# Patient Record
Sex: Male | Born: 1955 | Race: White | Hispanic: No | Marital: Married | Smoking: Never smoker
Health system: Southern US, Community
[De-identification: ages and names within clinical notes are randomized; demographics above are authoritative.]

## PROBLEM LIST (undated history)

## (undated) DIAGNOSIS — F32A Depression, unspecified: Secondary | ICD-10-CM

## (undated) DIAGNOSIS — H409 Unspecified glaucoma: Secondary | ICD-10-CM

## (undated) DIAGNOSIS — J069 Acute upper respiratory infection, unspecified: Secondary | ICD-10-CM

## (undated) DIAGNOSIS — H348192 Central retinal vein occlusion, unspecified eye, stable: Secondary | ICD-10-CM

## (undated) DIAGNOSIS — L0591 Pilonidal cyst without abscess: Secondary | ICD-10-CM

## (undated) DIAGNOSIS — Z789 Other specified health status: Secondary | ICD-10-CM

## (undated) DIAGNOSIS — I10 Essential (primary) hypertension: Secondary | ICD-10-CM

## (undated) DIAGNOSIS — F419 Anxiety disorder, unspecified: Secondary | ICD-10-CM

## (undated) DIAGNOSIS — K219 Gastro-esophageal reflux disease without esophagitis: Secondary | ICD-10-CM

## (undated) DIAGNOSIS — E785 Hyperlipidemia, unspecified: Secondary | ICD-10-CM

## (undated) DIAGNOSIS — Z0282 Encounter for adoption services: Secondary | ICD-10-CM

## (undated) DIAGNOSIS — D472 Monoclonal gammopathy: Secondary | ICD-10-CM

## (undated) HISTORY — DX: Gastro-esophageal reflux disease without esophagitis: K21.9

## (undated) HISTORY — DX: Monoclonal gammopathy: D47.2

## (undated) HISTORY — DX: Central retinal vein occlusion, unspecified eye, stable: H34.8192

## (undated) HISTORY — DX: Hyperlipidemia, unspecified: E78.5

## (undated) HISTORY — DX: Pilonidal cyst without abscess: L05.91

## (undated) HISTORY — PX: PILONIDAL CYST / SINUS EXCISION: SUR543

## (undated) HISTORY — DX: Essential (primary) hypertension: I10

## (undated) HISTORY — DX: Unspecified glaucoma: H40.9

## (undated) HISTORY — PX: TONSILLECTOMY: SUR1361

---

## 1994-10-27 HISTORY — PX: INGUINAL HERNIA REPAIR: SUR1180

## 1999-05-03 ENCOUNTER — Ambulatory Visit (HOSPITAL_COMMUNITY): Admission: RE | Admit: 1999-05-03 | Discharge: 1999-05-03 | Payer: Self-pay | Admitting: Internal Medicine

## 2000-11-25 ENCOUNTER — Ambulatory Visit (HOSPITAL_COMMUNITY): Admission: RE | Admit: 2000-11-25 | Discharge: 2000-11-25 | Payer: Self-pay | Admitting: Hematology and Oncology

## 2000-12-17 ENCOUNTER — Encounter: Admission: RE | Admit: 2000-12-17 | Discharge: 2000-12-17 | Payer: Self-pay | Admitting: Internal Medicine

## 2001-02-22 ENCOUNTER — Ambulatory Visit (HOSPITAL_COMMUNITY): Admission: RE | Admit: 2001-02-22 | Discharge: 2001-02-22 | Payer: Self-pay | Admitting: Neurology

## 2001-02-22 ENCOUNTER — Encounter: Payer: Self-pay | Admitting: Neurology

## 2007-05-18 ENCOUNTER — Emergency Department: Payer: Self-pay | Admitting: Emergency Medicine

## 2007-10-28 HISTORY — PX: UPPER GI ENDOSCOPY: SHX6162

## 2007-10-28 HISTORY — PX: COLONOSCOPY: SHX174

## 2009-04-26 ENCOUNTER — Ambulatory Visit: Payer: Self-pay | Admitting: Internal Medicine

## 2009-04-27 ENCOUNTER — Ambulatory Visit: Payer: Self-pay | Admitting: Internal Medicine

## 2009-05-27 ENCOUNTER — Ambulatory Visit: Payer: Self-pay | Admitting: Internal Medicine

## 2009-06-27 ENCOUNTER — Ambulatory Visit: Payer: Self-pay | Admitting: Internal Medicine

## 2009-09-10 ENCOUNTER — Ambulatory Visit: Payer: Self-pay | Admitting: Unknown Physician Specialty

## 2009-09-26 ENCOUNTER — Ambulatory Visit: Payer: Self-pay | Admitting: Internal Medicine

## 2009-09-28 ENCOUNTER — Ambulatory Visit: Payer: Self-pay | Admitting: Internal Medicine

## 2009-10-27 ENCOUNTER — Ambulatory Visit: Payer: Self-pay | Admitting: Internal Medicine

## 2010-03-26 ENCOUNTER — Ambulatory Visit: Payer: Self-pay | Admitting: Internal Medicine

## 2010-03-27 ENCOUNTER — Ambulatory Visit: Payer: Self-pay | Admitting: Internal Medicine

## 2010-04-26 ENCOUNTER — Ambulatory Visit: Payer: Self-pay | Admitting: Internal Medicine

## 2010-10-01 ENCOUNTER — Ambulatory Visit: Payer: Self-pay | Admitting: Internal Medicine

## 2010-10-27 ENCOUNTER — Ambulatory Visit: Payer: Self-pay | Admitting: Internal Medicine

## 2011-10-09 ENCOUNTER — Ambulatory Visit: Payer: Self-pay | Admitting: Internal Medicine

## 2011-10-28 ENCOUNTER — Ambulatory Visit: Payer: Self-pay | Admitting: Internal Medicine

## 2012-10-07 ENCOUNTER — Ambulatory Visit: Payer: Self-pay | Admitting: Internal Medicine

## 2012-10-07 LAB — CBC CANCER CENTER
Basophil #: 0 x10 3/mm (ref 0.0–0.1)
Basophil %: 0.5 %
Eosinophil #: 0.3 x10 3/mm (ref 0.0–0.7)
Eosinophil %: 3.9 %
HCT: 45.2 % (ref 40.0–52.0)
HGB: 15.7 g/dL (ref 13.0–18.0)
Lymphocyte #: 1.5 x10 3/mm (ref 1.0–3.6)
Lymphocyte %: 20.7 %
MCH: 30.8 pg (ref 26.0–34.0)
MCHC: 34.8 g/dL (ref 32.0–36.0)
MCV: 89 fL (ref 80–100)
Monocyte #: 0.5 x10 3/mm (ref 0.2–1.0)
Monocyte %: 7.3 %
Neutrophil #: 4.9 x10 3/mm (ref 1.4–6.5)
Neutrophil %: 67.6 %
Platelet: 261 x10 3/mm (ref 150–440)
RBC: 5.11 10*6/uL (ref 4.40–5.90)
RDW: 13 % (ref 11.5–14.5)
WBC: 7.2 x10 3/mm (ref 3.8–10.6)

## 2012-10-08 LAB — PROT IMMUNOELECTROPHORES(ARMC)

## 2012-10-27 ENCOUNTER — Ambulatory Visit: Payer: Self-pay | Admitting: Internal Medicine

## 2013-01-25 ENCOUNTER — Ambulatory Visit: Payer: Self-pay | Admitting: Internal Medicine

## 2013-06-13 ENCOUNTER — Ambulatory Visit: Payer: Self-pay | Admitting: Internal Medicine

## 2013-10-13 ENCOUNTER — Ambulatory Visit: Payer: Self-pay | Admitting: Internal Medicine

## 2013-10-14 LAB — CBC CANCER CENTER
Basophil #: 0 x10 3/mm (ref 0.0–0.1)
Basophil %: 0.7 %
Eosinophil #: 0.5 x10 3/mm (ref 0.0–0.7)
Eosinophil %: 7.4 %
HCT: 44.7 % (ref 40.0–52.0)
HGB: 14.9 g/dL (ref 13.0–18.0)
Lymphocyte #: 1.7 x10 3/mm (ref 1.0–3.6)
Lymphocyte %: 24.4 %
MCH: 30.1 pg (ref 26.0–34.0)
MCHC: 33.2 g/dL (ref 32.0–36.0)
MCV: 91 fL (ref 80–100)
Monocyte #: 0.5 x10 3/mm (ref 0.2–1.0)
Monocyte %: 7.7 %
Neutrophil #: 4.1 x10 3/mm (ref 1.4–6.5)
Neutrophil %: 59.8 %
Platelet: 238 x10 3/mm (ref 150–440)
RBC: 4.94 10*6/uL (ref 4.40–5.90)
RDW: 13.1 % (ref 11.5–14.5)
WBC: 6.9 x10 3/mm (ref 3.8–10.6)

## 2013-10-17 LAB — PROT IMMUNOELECTROPHORES(ARMC)

## 2013-10-19 LAB — COMPREHENSIVE METABOLIC PANEL
Albumin: 4 g/dL (ref 3.4–5.0)
Alkaline Phosphatase: 76 U/L
Anion Gap: 9 (ref 7–16)
BUN: 12 mg/dL (ref 7–18)
Bilirubin,Total: 0.3 mg/dL (ref 0.2–1.0)
Calcium, Total: 9.1 mg/dL (ref 8.5–10.1)
Chloride: 105 mmol/L (ref 98–107)
Co2: 29 mmol/L (ref 21–32)
Creatinine: 0.95 mg/dL (ref 0.60–1.30)
EGFR (African American): >60
EGFR (Non-African Amer.): >60
Glucose: 80 mg/dL (ref 65–99)
Osmolality: 284 (ref 275–301)
Potassium: 4.2 mmol/L (ref 3.5–5.1)
SGOT(AST): 24 U/L (ref 15–37)
SGPT (ALT): 46 U/L (ref 12–78)
Sodium: 143 mmol/L (ref 136–145)
Total Protein: 7.6 g/dL (ref 6.4–8.2)

## 2013-10-27 ENCOUNTER — Ambulatory Visit: Payer: Self-pay | Admitting: Internal Medicine

## 2014-02-16 ENCOUNTER — Ambulatory Visit: Payer: Self-pay | Admitting: Internal Medicine

## 2014-06-15 ENCOUNTER — Ambulatory Visit: Payer: Self-pay | Admitting: Internal Medicine

## 2014-10-12 ENCOUNTER — Ambulatory Visit: Payer: Self-pay | Admitting: Internal Medicine

## 2014-10-12 LAB — CBC CANCER CENTER
Basophil #: 0 x10 3/mm (ref 0.0–0.1)
Basophil %: 0.3 %
Eosinophil #: 0.3 x10 3/mm (ref 0.0–0.7)
Eosinophil %: 4.5 %
HCT: 43 % (ref 40.0–52.0)
HGB: 14.5 g/dL (ref 13.0–18.0)
Lymphocyte #: 1.5 x10 3/mm (ref 1.0–3.6)
Lymphocyte %: 23.4 %
MCH: 30.5 pg (ref 26.0–34.0)
MCHC: 33.7 g/dL (ref 32.0–36.0)
MCV: 91 fL (ref 80–100)
Monocyte #: 0.4 x10 3/mm (ref 0.2–1.0)
Monocyte %: 6.3 %
Neutrophil #: 4.2 x10 3/mm (ref 1.4–6.5)
Neutrophil %: 65.5 %
Platelet: 255 x10 3/mm (ref 150–440)
RBC: 4.74 10*6/uL (ref 4.40–5.90)
RDW: 12.6 % (ref 11.5–14.5)
WBC: 6.4 x10 3/mm (ref 3.8–10.6)

## 2014-10-12 LAB — CREATININE, SERUM
Creatinine: 0.89 mg/dL (ref 0.60–1.30)
EGFR (African American): >60
EGFR (Non-African Amer.): >60

## 2014-10-16 LAB — PROT IMMUNOELECTROPHORES(ARMC)

## 2014-10-27 ENCOUNTER — Ambulatory Visit: Payer: Self-pay | Admitting: Internal Medicine

## 2015-02-19 ENCOUNTER — Ambulatory Visit
Admit: 2015-02-19 | Disposition: A | Discharge status: Home / Self Care | Payer: Self-pay | Attending: Internal Medicine | Admitting: Internal Medicine

## 2015-02-19 LAB — CREATININE, SERUM
Creatinine: 0.9 mg/dL
EGFR (African American): >60
EGFR (Non-African Amer.): >60

## 2015-02-20 LAB — PROT IMMUNOELECTROPHORES(ARMC)

## 2015-06-15 ENCOUNTER — Inpatient Hospital Stay: Payer: Self-pay | Attending: Family Medicine

## 2015-10-11 ENCOUNTER — Other Ambulatory Visit: Payer: Self-pay | Admitting: *Deleted

## 2015-10-11 DIAGNOSIS — D472 Monoclonal gammopathy: Secondary | ICD-10-CM

## 2015-10-12 ENCOUNTER — Inpatient Hospital Stay: Payer: 59

## 2015-10-12 ENCOUNTER — Inpatient Hospital Stay: Payer: 59 | Admitting: Internal Medicine

## 2015-11-13 ENCOUNTER — Encounter: Payer: Self-pay | Admitting: *Deleted

## 2015-11-14 ENCOUNTER — Encounter: Payer: Self-pay | Admitting: *Deleted

## 2015-11-14 ENCOUNTER — Inpatient Hospital Stay: Payer: 59 | Admitting: Internal Medicine

## 2015-11-14 ENCOUNTER — Inpatient Hospital Stay: Payer: 59 | Attending: Internal Medicine

## 2015-11-14 NOTE — Progress Notes (Signed)
No show x 3. Will send final no show letter to patient.

## 2016-09-04 ENCOUNTER — Encounter: Payer: Self-pay | Admitting: *Deleted

## 2016-09-09 DIAGNOSIS — J069 Acute upper respiratory infection, unspecified: Secondary | ICD-10-CM

## 2016-09-09 HISTORY — DX: Acute upper respiratory infection, unspecified: J06.9

## 2016-09-10 ENCOUNTER — Encounter: Payer: Self-pay | Admitting: General Surgery

## 2016-09-10 ENCOUNTER — Ambulatory Visit (INDEPENDENT_AMBULATORY_CARE_PROVIDER_SITE_OTHER): Payer: BLUE CROSS/BLUE SHIELD | Admitting: General Surgery

## 2016-09-10 VITALS — BP 110/76 | HR 66 | Resp 12 | Ht 72.0 in | Wt 167.0 lb

## 2016-09-10 DIAGNOSIS — K4090 Unilateral inguinal hernia, without obstruction or gangrene, not specified as recurrent: Secondary | ICD-10-CM

## 2016-09-10 NOTE — Patient Instructions (Signed)
Inguinal Hernia, Adult Introduction An inguinal hernia is when fat or the intestines push through the area where the leg meets the lower belly (groin) and make a rounded lump (bulge). This condition happens over time. There are three types of inguinal hernias. These types include:  Hernias that can be pushed back into the belly (are reducible).  Hernias that cannot be pushed back into the belly (are incarcerated).  Hernias that cannot be pushed back into the belly and lose their blood supply (get strangulated). This type needs emergency surgery. Follow these instructions at home: Lifestyle  Drink enough fluid to keep your urine (pee) clear or pale yellow.  Eat plenty of fruits, vegetables, and whole grains. These have a lot of fiber. Talk with your doctor if you have questions.  Avoid lifting heavy objects.  Avoid standing for long periods of time.  Do not use tobacco products. These include cigarettes, chewing tobacco, or e-cigarettes. If you need help quitting, ask your doctor.  Try to stay at a healthy weight. General instructions  Do not try to force the hernia back in.  Watch your hernia for any changes in color or size. Let your doctor know if there are any changes.  Take over-the-counter and prescription medicines only as told by your doctor.  Keep all follow-up visits as told by your doctor. This is important. Contact a doctor if:  You have a fever.  You have new symptoms.  Your symptoms get worse. Get help right away if:  The area where the legs meets the lower belly has:  Pain that gets worse suddenly.  A bulge that gets bigger suddenly and does not go down.  A bulge that turns red or purple.  A bulge that is painful to the touch.  You are a man and your scrotum:  Suddenly feels painful.  Suddenly changes in size.  You feel sick to your stomach (nauseous) and this feeling does not go away.  You throw up (vomit) and this keeps happening.  You feel  your heart beating a lot more quickly than normal.  You cannot poop (have a bowel movement) or pass gas. This information is not intended to replace advice given to you by your health care provider. Make sure you discuss any questions you have with your health care provider. Document Released: 11/13/2006 Document Revised: 03/20/2016 Document Reviewed: 08/23/2014  2017 Elsevier  

## 2016-09-10 NOTE — Progress Notes (Signed)
Patient ID: Jerry Hire, MD, male   DOB: 1956/06/06, 60 y.o.   MRN: [id number]  Chief Complaint  Patient presents with  . Other    left inguinal hernia  . 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 He states he notified the area about 6 months ago. It does seem to be causing some discomfort. Denies any trauma or injury to the area. Previous hernia was 1996 while moving a piano. I personally reviewed the patient's history.   HPI  Past Medical History:  Diagnosis Date  . Central retinal vein occlusion   . GERD (gastroesophageal reflux disease)   . Glaucoma   . HTN (hypertension)   . Hyperlipidemia   . MGUS (monoclonal gammopathy of unknown significance)   . Pilonidal cyst     Past Surgical History:  Procedure Laterality Date  . COLONOSCOPY  2009   Dr Vira Agar  . INGUINAL HERNIA REPAIR Right 1996  . TONSILLECTOMY    . UPPER GI ENDOSCOPY  2009   Dr Vira Agar    Family History  Problem Relation Age of Onset  . Adopted: Yes    Social History Social History  Substance Use Topics  . Smoking status: Never Smoker  . Smokeless tobacco: Never Used  . Alcohol use 0.6 oz/week    1 Standard drinks or equivalent per week    No Known Allergies  Current Outpatient Prescriptions  Medication Sig Dispense Refill  . atorvastatin (LIPITOR) 10 MG tablet Take 10 mg by mouth daily.    . hydrochlorothiazide (MICROZIDE) 12.5 MG capsule   4  . losartan (COZAAR) 25 MG tablet Take 25 mg by mouth daily.   4  . Omeprazole (PRILOSEC PO) Take 20 mg by mouth.     . timolol (BETIMOL) 0.25 % ophthalmic solution Place 1-2 drops into both eyes 2 (two) times daily.     No current facility-administered medications for this visit.     Review of Systems Review of Systems  Constitutional: Negative.   Respiratory: Negative.   Cardiovascular: Negative.     Blood pressure 110/76, pulse 66, resp. rate 12, height 6' (1.829 m),  weight 167 lb (75.8 kg), SpO2 98 %.  Physical Exam Physical Exam  Constitutional: He is oriented to person, place, and time. He appears well-developed and well-nourished.  HENT:  Mouth/Throat: Oropharynx is clear and moist.  Eyes: Conjunctivae are normal. No scleral icterus.  Neck: Neck supple.  Cardiovascular: Normal rate, regular rhythm and normal heart sounds.   Pulmonary/Chest: Effort normal and breath sounds normal.  Abdominal: Soft. There is no tenderness.  Genitourinary: Testes normal.     Lymphadenopathy:    He has no cervical adenopathy.  Neurological: He is alert and oriented to person, place, and time.  Skin: Skin is warm and dry.  Psychiatric: His behavior is normal.    Data Reviewed None  Assessment    Left inguinal hernia     Plan        Hernia precautions and incarceration were discussed with the patient. If they develop symptoms of an incarcerated hernia, they were encouraged to seek prompt medical attention.  I have recommended repair of the hernia using mesh on an outpatient basis in the near future. The risk of infection was reviewed. The role of prosthetic mesh to minimize the risk of recurrence was reviewed.  Patient's surgery has been scheduled for 09-15-16 at [facility].  This information has been scribed by Karie Fetch RN, BSN,BC.    Robert Bellow 09/10/2016, 9:31 AM

## 2016-09-11 ENCOUNTER — Encounter
Admission: RE | Admit: 2016-09-11 | Discharge: 2016-09-11 | Disposition: A | Discharge status: Home / Self Care | Payer: BLUE CROSS/BLUE SHIELD | Source: Ambulatory Visit | Attending: General Surgery | Admitting: General Surgery

## 2016-09-11 ENCOUNTER — Inpatient Hospital Stay: Admission: RE | Admit: 2016-09-11 | Payer: BLUE CROSS/BLUE SHIELD | Source: Ambulatory Visit

## 2016-09-11 DIAGNOSIS — Z01812 Encounter for preprocedural laboratory examination: Secondary | ICD-10-CM | POA: Insufficient documentation

## 2016-09-11 DIAGNOSIS — Z01810 Encounter for preprocedural cardiovascular examination: Secondary | ICD-10-CM | POA: Insufficient documentation

## 2016-09-11 DIAGNOSIS — I10 Essential (primary) hypertension: Secondary | ICD-10-CM | POA: Diagnosis not present

## 2016-09-11 HISTORY — DX: Encounter for adoption services: Z02.82

## 2016-09-11 HISTORY — DX: Other specified health status: Z78.9

## 2016-09-11 LAB — CBC
HCT: 46.6 % (ref 40.0–52.0)
Hemoglobin: 15.6 g/dL (ref 13.0–18.0)
MCH: 30.4 pg (ref 26.0–34.0)
MCHC: 33.4 g/dL (ref 32.0–36.0)
MCV: 90.9 fL (ref 80.0–100.0)
PLATELETS: 247 10*3/uL (ref 150–440)
RBC: 5.12 MIL/uL (ref 4.40–5.90)
RDW: 12.9 % (ref 11.5–14.5)
WBC: 13.3 10*3/uL — AB (ref 3.8–10.6)

## 2016-09-11 LAB — POTASSIUM: POTASSIUM: 4 mmol/L (ref 3.5–5.1)

## 2016-09-11 NOTE — Patient Instructions (Signed)
  Your procedure is scheduled on: 09-15-16 [facility]) Report to Same Day Surgery 2nd floor medical mall To find out your arrival time please call [phone] between [place] on 09-12-16 (FRIDAY)  Remember: Instructions that are not followed completely may result in serious medical risk, up to and including death, or upon the discretion of your surgeon and anesthesiologist your surgery may need to be rescheduled.    _x___ 1. Do not eat food or drink liquids after midnight. No gum chewing or hard candies.     __x__ 2. No Alcohol for 24 hours before or after surgery.   __x__3. No Smoking for 24 prior to surgery.   ____  4. Bring all medications with you on the day of surgery if instructed.    __x__ 5. Notify your doctor if there is any change in your medical condition     (cold, fever, infections).     Do not wear jewelry, make-up, hairpins, clips or nail polish.  Do not wear lotions, powders, or perfumes. You may wear deodorant.  Do not shave 48 hours prior to surgery. Men may shave face and neck.  Do not bring valuables to the hospital.    [facility] is not responsible for any belongings or valuables.               Contacts, dentures or bridgework may not be worn into surgery.  Leave your suitcase in the car. After surgery it may be brought to your room.  For patients admitted to the hospital, discharge time is determined by your treatment team.   Patients discharged the day of surgery will not be allowed to drive home.    Please read over the following fact sheets that you were given:   [facility] Preparing for Surgery and or MRSA Information   _x___ Take these medicines the morning of surgery with A SIP OF WATER:    1. ATORVASTATIN  2. LOSARTAN  3. PRILOSEC  4. TAKE A PRILOSEC ON Sunday NIGHT BEFORE BED (09-14-16)  5.  6.  ____Fleets enema or Magnesium Citrate as directed.   _x___ Use CHG Soap or sage wipes as directed on instruction sheet   ____ Use inhalers on the  day of surgery and bring to hospital day of surgery  ____ Stop metformin 2 days prior to surgery    ____ Take 1/2 of usual insulin dose the night before surgery and none on the morning of surgery.   ____ Stop aspirin or coumadin, or plavix  __ Stop Anti-inflammatories such as Advil, Aleve, Ibuprofen, Motrin, Naproxen,          Naprosyn, Goodies powders or aspirin products. Ok to take Tylenol.   ____ Stop supplements until after surgery.    ____ Bring C-Pap to the hospital.

## 2016-09-12 ENCOUNTER — Encounter: Payer: Self-pay | Admitting: *Deleted

## 2016-09-12 ENCOUNTER — Telehealth: Payer: Self-pay | Admitting: General Surgery

## 2016-09-12 NOTE — Telephone Encounter (Signed)
Patient reports URI symptoms w/o cough developed 11/14, started on Levoquin at that time thru Dr. Doy Hutching. Failed to mention at [place]. Steady improvement since that time. Anticipate we will proceed with hernia repair on November 20th as planned.

## 2016-09-14 MED ORDER — CEFAZOLIN SODIUM-DEXTROSE 2-4 GM/100ML-% IV SOLN
2.0000 g | INTRAVENOUS | Status: AC
  Administered 2016-09-15: 2 g via INTRAVENOUS

## 2016-09-15 ENCOUNTER — Ambulatory Visit: Payer: BLUE CROSS/BLUE SHIELD | Admitting: Anesthesiology

## 2016-09-15 ENCOUNTER — Encounter
Admission: RE | Disposition: A | Discharge status: Home / Self Care | Payer: Self-pay | Source: Ambulatory Visit | Attending: General Surgery

## 2016-09-15 ENCOUNTER — Ambulatory Visit
Admission: RE | Admit: 2016-09-15 | Discharge: 2016-09-15 | Disposition: A | Discharge status: Home / Self Care | Payer: BLUE CROSS/BLUE SHIELD | Source: Ambulatory Visit | Attending: General Surgery | Admitting: General Surgery

## 2016-09-15 ENCOUNTER — Encounter: Payer: Self-pay | Admitting: Anesthesiology

## 2016-09-15 DIAGNOSIS — Z79899 Other long term (current) drug therapy: Secondary | ICD-10-CM | POA: Diagnosis not present

## 2016-09-15 DIAGNOSIS — D176 Benign lipomatous neoplasm of spermatic cord: Secondary | ICD-10-CM | POA: Diagnosis not present

## 2016-09-15 DIAGNOSIS — I10 Essential (primary) hypertension: Secondary | ICD-10-CM | POA: Diagnosis not present

## 2016-09-15 DIAGNOSIS — K219 Gastro-esophageal reflux disease without esophagitis: Secondary | ICD-10-CM | POA: Insufficient documentation

## 2016-09-15 DIAGNOSIS — K4090 Unilateral inguinal hernia, without obstruction or gangrene, not specified as recurrent: Secondary | ICD-10-CM | POA: Insufficient documentation

## 2016-09-15 HISTORY — PX: INGUINAL HERNIA REPAIR: SHX194

## 2016-09-15 HISTORY — DX: Acute upper respiratory infection, unspecified: J06.9

## 2016-09-15 SURGERY — REPAIR, HERNIA, INGUINAL, ADULT
Anesthesia: General | Laterality: Left | Wound class: Clean Contaminated

## 2016-09-15 MED ORDER — PROPOFOL 10 MG/ML IV BOLUS
INTRAVENOUS | Status: DC | PRN
  Administered 2016-09-15: 30 mg via INTRAVENOUS
  Administered 2016-09-15: 100 mg via INTRAVENOUS

## 2016-09-15 MED ORDER — FENTANYL CITRATE (PF) 100 MCG/2ML IJ SOLN
25.0000 ug | INTRAMUSCULAR | Status: DC | PRN
  Administered 2016-09-15 (×4): 25 ug via INTRAVENOUS

## 2016-09-15 MED ORDER — MIDAZOLAM HCL 2 MG/2ML IJ SOLN
INTRAMUSCULAR | Status: DC | PRN
  Administered 2016-09-15: 2 mg via INTRAVENOUS

## 2016-09-15 MED ORDER — FENTANYL CITRATE (PF) 100 MCG/2ML IJ SOLN
INTRAMUSCULAR | Status: AC
  Filled 2016-09-15: qty 2

## 2016-09-15 MED ORDER — ACETAMINOPHEN 10 MG/ML IV SOLN
INTRAVENOUS | Status: AC
  Filled 2016-09-15: qty 100

## 2016-09-15 MED ORDER — BUPIVACAINE-EPINEPHRINE (PF) 0.5% -1:200000 IJ SOLN
INTRAMUSCULAR | Status: AC
  Filled 2016-09-15: qty 30

## 2016-09-15 MED ORDER — KETOROLAC TROMETHAMINE 30 MG/ML IJ SOLN
INTRAMUSCULAR | Status: DC | PRN
  Administered 2016-09-15: 30 mg via INTRAVENOUS

## 2016-09-15 MED ORDER — HYDROCODONE-ACETAMINOPHEN 5-325 MG PO TABS: 1.0000 | ORAL_TABLET | ORAL | 0 refills | Status: DC | PRN

## 2016-09-15 MED ORDER — HYDROCODONE-ACETAMINOPHEN 5-325 MG PO TABS
ORAL_TABLET | ORAL | Status: AC
  Filled 2016-09-15: qty 1

## 2016-09-15 MED ORDER — ONDANSETRON HCL 4 MG/2ML IJ SOLN: 4.0000 mg | Freq: Once | INTRAMUSCULAR | Status: DC | PRN

## 2016-09-15 MED ORDER — LACTATED RINGERS IV SOLN
INTRAVENOUS | Status: DC
  Administered 2016-09-15: 13:00:00 via INTRAVENOUS

## 2016-09-15 MED ORDER — BUPIVACAINE-EPINEPHRINE (PF) 0.5% -1:200000 IJ SOLN
INTRAMUSCULAR | Status: DC | PRN
  Administered 2016-09-15: 20 mL via PERINEURAL
  Administered 2016-09-15: 5 mL via PERINEURAL

## 2016-09-15 MED ORDER — ONDANSETRON HCL 4 MG/2ML IJ SOLN
INTRAMUSCULAR | Status: DC | PRN
  Administered 2016-09-15: 4 mg via INTRAVENOUS

## 2016-09-15 MED ORDER — CEFAZOLIN SODIUM-DEXTROSE 2-4 GM/100ML-% IV SOLN
INTRAVENOUS | Status: AC
  Filled 2016-09-15: qty 100

## 2016-09-15 MED ORDER — HYDROCODONE-ACETAMINOPHEN 5-325 MG PO TABS
1.0000 | ORAL_TABLET | ORAL | Status: DC | PRN
  Administered 2016-09-15: 1 via ORAL

## 2016-09-15 MED ORDER — ACETAMINOPHEN 10 MG/ML IV SOLN
INTRAVENOUS | Status: DC | PRN
  Administered 2016-09-15: 1000 mg via INTRAVENOUS

## 2016-09-15 MED ORDER — DEXAMETHASONE SODIUM PHOSPHATE 10 MG/ML IJ SOLN
INTRAMUSCULAR | Status: DC | PRN
  Administered 2016-09-15: 4 mg via INTRAVENOUS

## 2016-09-15 SURGICAL SUPPLY — 38 items
APL SKNCLS STERI-STRIP NONHPOA (GAUZE/BANDAGES/DRESSINGS) ×1
BENZOIN TINCTURE PRP APPL 2/3 (GAUZE/BANDAGES/DRESSINGS) ×3 IMPLANT
BLADE SURG 15 STRL SS SAFETY (BLADE) ×6 IMPLANT
CANISTER SUCT 1200ML W/VALVE (MISCELLANEOUS) ×3 IMPLANT
CHLORAPREP W/TINT 26ML (MISCELLANEOUS) ×3 IMPLANT
CLOSURE WOUND 1/2 X4 (GAUZE/BANDAGES/DRESSINGS) ×1
CLOSURE WOUND 1/4X4 (GAUZE/BANDAGES/DRESSINGS) ×1
DECANTER SPIKE VIAL GLASS SM (MISCELLANEOUS) ×3 IMPLANT
DRAIN PENROSE 1/4X12 LTX (DRAIN) ×3 IMPLANT
DRAPE LAPAROTOMY 100X77 ABD (DRAPES) ×3 IMPLANT
DRESSING TELFA 4X3 1S ST N-ADH (GAUZE/BANDAGES/DRESSINGS) ×3 IMPLANT
DRSG TEGADERM 4X4.75 (GAUZE/BANDAGES/DRESSINGS) ×3 IMPLANT
DRSG TELFA 3X8 NADH (GAUZE/BANDAGES/DRESSINGS) ×3 IMPLANT
ELECT REM PT RETURN 9FT ADLT (ELECTROSURGICAL) ×3
ELECTRODE REM PT RTRN 9FT ADLT (ELECTROSURGICAL) ×1 IMPLANT
GLOVE BIO SURGEON STRL SZ7.5 (GLOVE) ×3 IMPLANT
GLOVE INDICATOR 8.0 STRL GRN (GLOVE) ×3 IMPLANT
GOWN STRL REUS W/ TWL LRG LVL3 (GOWN DISPOSABLE) ×2 IMPLANT
GOWN STRL REUS W/TWL LRG LVL3 (GOWN DISPOSABLE) ×6
KIT RM TURNOVER STRD PROC AR (KITS) ×3 IMPLANT
LABEL OR SOLS (LABEL) ×3 IMPLANT
MESH MARLEX PLUG MEDIUM (Mesh General) ×3 IMPLANT
NDL SAFETY 22GX1.5 (NEEDLE) ×6 IMPLANT
NEEDLE HYPO 25X1 1.5 SAFETY (NEEDLE) ×3 IMPLANT
PACK BASIN MINOR ARMC (MISCELLANEOUS) ×3 IMPLANT
STRIP CLOSURE SKIN 1/2X4 (GAUZE/BANDAGES/DRESSINGS) ×2 IMPLANT
STRIP CLOSURE SKIN 1/4X4 (GAUZE/BANDAGES/DRESSINGS) ×2 IMPLANT
SUT SURGILON 0 BLK (SUTURE) ×6 IMPLANT
SUT VIC AB 2-0 SH 27 (SUTURE) ×2
SUT VIC AB 2-0 SH 27XBRD (SUTURE) ×1 IMPLANT
SUT VIC AB 3-0 54X BRD REEL (SUTURE) ×1 IMPLANT
SUT VIC AB 3-0 BRD 54 (SUTURE) ×2
SUT VIC AB 3-0 SH 27 (SUTURE) ×3
SUT VIC AB 3-0 SH 27X BRD (SUTURE) ×1 IMPLANT
SUT VIC AB 4-0 FS2 27 (SUTURE) ×3 IMPLANT
SWABSTK COMLB BENZOIN TINCTURE (MISCELLANEOUS) ×3 IMPLANT
SYR 3ML LL SCALE MARK (SYRINGE) ×3 IMPLANT
SYR CONTROL 10ML (SYRINGE) ×6 IMPLANT

## 2016-09-15 NOTE — Discharge Instructions (Signed)

## 2016-09-15 NOTE — Anesthesia Preprocedure Evaluation (Addendum)
Anesthesia Evaluation  Patient identified by MRN, date of birth, ID band Patient awake    Reviewed: Allergy & Precautions, NPO status , Patient's Chart, lab work & pertinent test results, reviewed documented beta blocker date and time   Airway Mallampati: II  TM Distance: >3 FB     Dental  (+) Chipped   Pulmonary           Cardiovascular hypertension, Pt. on medications + Peripheral Vascular Disease       Neuro/Psych    GI/Hepatic GERD  Controlled,  Endo/Other    Renal/GU      Musculoskeletal   Abdominal   Peds  Hematology   Anesthesia Other Findings EKG ok.  Reproductive/Obstetrics                            Anesthesia Physical Anesthesia Plan  ASA: III  Anesthesia Plan: General   Post-op Pain Management:    Induction: Intravenous  Airway Management Planned: LMA  Additional Equipment:   Intra-op Plan:   Post-operative Plan:   Informed Consent: I have reviewed the patients History and Physical, chart, labs and discussed the procedure including the risks, benefits and alternatives for the proposed anesthesia with the patient or authorized representative who has indicated his/her understanding and acceptance.     Plan Discussed with: CRNA  Anesthesia Plan Comments:         Anesthesia Quick Evaluation

## 2016-09-15 NOTE — Anesthesia Postprocedure Evaluation (Signed)
Anesthesia Post Note  Patient: Jerry Hire, MD  Procedure(s) Performed: Procedure(s) (LRB): HERNIA REPAIR INGUINAL ADULT (Left)  Patient location during evaluation: PACU Anesthesia Type: General Level of consciousness: awake and alert Pain management: pain level controlled Vital Signs Assessment: post-procedure vital signs reviewed and stable Respiratory status: spontaneous breathing, nonlabored ventilation, respiratory function stable and patient connected to nasal cannula oxygen Cardiovascular status: blood pressure returned to baseline and stable Postop Assessment: no signs of nausea or vomiting Anesthetic complications: no    Last Vitals:  Vitals:   09/15/16 1458 09/15/16 1512  BP: 123/83 129/83  Pulse: 65 66  Resp: 15 16  Temp:  36.2 C    Last Pain:  Vitals:   09/15/16 1458  TempSrc:   PainSc: [place]

## 2016-09-15 NOTE — Op Note (Signed)
Preoperative diagnosis: Symptomatic left inguinal hernia.  Postoperative diagnosis: Same, direct. Lipoma of the cord.  Operative procedure: Repair of left direct inguinal hernia with medium Bard PerFix plug. Excision of lipoma of the cord.  Operating surgeon: Hervey Ard, M.D.  Anesthesia: Gen. endotracheal, Marcaine 0.5% with 1-200,000 epinephrine, 30 mL; Toradol 30 mg.  Estimated blood loss: Less than 5 mL.  Clinical note: This 60 year old male developed a symptomatic left inguinal hernia. He was therefore elected to repair.  Operative note: With the patient under adequate general anesthesia and hair previously removed with clippers the area was prepped with ChloraPrep and draped. A 5 cm skin line incision along the anticipated course the inguinal canal was carried down through skin and subcutaneous tissue with hemostasis achieved by electrocautery. The external oblique was opened in the direction of its fibers. The ilioinguinal and iliohypogastric nerves were identified and protected. The cremasteric fibers were split and the cord delivered. A light pole of the cord was transected at the inguinal ring area with a 3-0 Vicryl tie. There was a direct defect. The sac was excised. The defect measured approximately 1 cm in diameter. A medium PerFix plug was placed and anchored in position with a 0 Surgilon suture 2. The overlay patch was then anchored to the pubic tubercle with similar suture. The inferior aspect of the mesh was anchored to the inguinal ligament with interrupted 0 Surgilon sutures. The medial and superior borders were anchored to the transversus abdominis aponeurosis with similar sutures. A lateral slit was closed. Both nerves were returned to their bed intact. The external oblique was closed after installation of Toradol into the wound. The external oblique was closed with a running 2-0 Vicryl. Scarpa's fascia was closed with a running 3-0 Vicryl and skin closed with a running 4-0  Vicryl subcuticular suture. Benzoin, Steri-Strips, Telfa and Tegaderm dressings were applied.  The patient tolerated the procedure well and was taken to recovery in stable condition.

## 2016-09-15 NOTE — Transfer of Care (Signed)
Immediate Anesthesia Transfer of Care Note  Patient: [name], MD  Procedure(s) Performed: Procedure(s): HERNIA REPAIR INGUINAL ADULT (Left)  Patient Location: PACU  Anesthesia Type:General  Level of Consciousness: awake, alert  and oriented  Airway & Oxygen Therapy: Patient Spontanous Breathing and Patient connected to face mask oxygen  Post-op Assessment: Report given to RN and Post -op Vital signs reviewed and stable  Post vital signs: Reviewed and stable  Last Vitals:  Vitals:   09/15/16 1120 09/15/16 1344  BP: (!) 140/92 132/84  Pulse: 68 78  Resp: 14 12  Temp: 36.4 C (!) 36 C    Last Pain:  Vitals:   09/15/16 1344  TempSrc:   PainSc: Asleep         Complications: No apparent anesthesia complications

## 2016-09-15 NOTE — Anesthesia Procedure Notes (Signed)
Procedure Name: LMA Insertion Date/Time: 09/15/2016 12:44 PM Performed by: Jennette Bill Pre-anesthesia Checklist: Patient identified, Patient being monitored, Timeout performed, Emergency Drugs available and Suction available Patient Re-evaluated:Patient Re-evaluated prior to inductionOxygen Delivery Method: Circle system utilized Preoxygenation: Pre-oxygenation with 100% oxygen Intubation Type: IV induction Ventilation: Mask ventilation without difficulty LMA: LMA inserted LMA Size: 4.0 Tube type: Oral Number of attempts: 1 Placement Confirmation: positive ETCO2 and breath sounds checked- equal and bilateral Tube secured with: Tape Dental Injury: Teeth and Oropharynx as per pre-operative assessment

## 2016-09-15 NOTE — H&P (Signed)
URI symptoms resolved. For [facility] repair.

## 2016-09-24 ENCOUNTER — Ambulatory Visit (INDEPENDENT_AMBULATORY_CARE_PROVIDER_SITE_OTHER): Payer: BLUE CROSS/BLUE SHIELD | Admitting: General Surgery

## 2016-09-24 ENCOUNTER — Encounter: Payer: Self-pay | Admitting: General Surgery

## 2016-09-24 VITALS — BP 110/68 | HR 68 | Resp 12 | Ht 72.0 in | Wt 169.0 lb

## 2016-09-24 DIAGNOSIS — K4090 Unilateral inguinal hernia, without obstruction or gangrene, not specified as recurrent: Secondary | ICD-10-CM

## 2016-09-24 NOTE — Progress Notes (Signed)
Patient ID: Jerry Hire, MD, male   DOB: Dec 13, 1955, 60 y.o.   MRN: [id number]  Chief Complaint  Patient presents with  . Routine Post Op    left inguinal hernia    HPI Jerry Hire, MD is a 60 y.o. male here today for his post op left inguinal hernia repair done on 09/15/16. Patient states he is doing well. Wife, [name], s present at visit. Patient reports yellow more pain than he expected, using about 15 Norco tablets post procedure. He did use Aleve as requested. HPI  Past Medical History:  Diagnosis Date  . Adopted   . Central retinal vein occlusion   . GERD (gastroesophageal reflux disease)   . Glaucoma   . HTN (hypertension)   . Hyperlipidemia   . MGUS (monoclonal gammopathy of unknown significance)   . Pilonidal cyst   . URI (upper respiratory infection) 09/09/2016   STARTED ON LEVAQUIN BY PCP    Past Surgical History:  Procedure Laterality Date  . COLONOSCOPY  2009   Dr Vira Agar  . INGUINAL HERNIA REPAIR Right 1996  . INGUINAL HERNIA REPAIR Left 09/15/2016   Left direct inguinal hernia repair w/ Bard Perfix plug and patch;  Surgeon: Robert Bellow, MD;  Location: [facility] ORS;  Service: General;  Laterality: Left;  . TONSILLECTOMY    . UPPER GI ENDOSCOPY  2009   Dr Vira Agar    Family History  Problem Relation Age of Onset  . Adopted: Yes    Social History Social History  Substance Use Topics  . Smoking status: Never Smoker  . Smokeless tobacco: Never Used  . Alcohol use 0.6 oz/week    1 Standard drinks or equivalent per week     Comment: 1 wine daily    No Known Allergies  Current Outpatient Prescriptions  Medication Sig Dispense Refill  . atorvastatin (LIPITOR) 10 MG tablet Take 10 mg by mouth every morning.     . hydrochlorothiazide (MICROZIDE) 12.5 MG capsule Take 12.5 mg by mouth daily.   4  . losartan (COZAAR) 25 MG tablet Take 25 mg by mouth every morning.   4  . Omeprazole (PRILOSEC PO) Take 20 mg by mouth daily as needed (heartburn).     .  timolol (BETIMOL) 0.25 % ophthalmic solution Place 1 drop into both eyes daily.      No current facility-administered medications for this visit.     Review of Systems Review of Systems  Constitutional: Negative.   Respiratory: Negative.   Gastrointestinal: Negative.     Blood pressure 110/68, pulse 68, resp. rate 12, height 6' (1.829 m), weight 169 lb (76.7 kg).  Physical Exam Physical Exam  Constitutional: He is oriented to person, place, and time. He appears well-developed and well-nourished.  Abdominal:  Left inguinal hernia repair is intact and healing well.  Genitourinary:     Neurological: He is alert and oriented to person, place, and time.  Skin: Skin is warm and dry.    Data Reviewed The patient was identified with a direct inguinal hernia.  Assessment    Doing well status post left open inguinal hernia repair.    Plan    Robert lifting technique reviewed. The patient will increase his activity as tolerated.    Return as needed. This information has been scribed by Gaspar Cola CMA.   Robert Bellow 09/24/2016, 9:31 PM

## 2016-09-24 NOTE — Patient Instructions (Signed)
Return as needed

## 2016-10-30 ENCOUNTER — Encounter: Payer: Self-pay | Admitting: General Surgery

## 2016-12-19 ENCOUNTER — Encounter: Payer: Self-pay | Admitting: General Surgery

## 2016-12-31 ENCOUNTER — Ambulatory Visit (INDEPENDENT_AMBULATORY_CARE_PROVIDER_SITE_OTHER): Payer: BLUE CROSS/BLUE SHIELD | Admitting: General Surgery

## 2016-12-31 ENCOUNTER — Encounter: Payer: Self-pay | Admitting: General Surgery

## 2016-12-31 VITALS — BP 124/80 | HR 74 | Resp 12 | Ht 72.0 in | Wt 173.0 lb

## 2016-12-31 DIAGNOSIS — K4090 Unilateral inguinal hernia, without obstruction or gangrene, not specified as recurrent: Secondary | ICD-10-CM

## 2016-12-31 DIAGNOSIS — R208 Other disturbances of skin sensation: Secondary | ICD-10-CM

## 2016-12-31 DIAGNOSIS — L7682 Other postprocedural complications of skin and subcutaneous tissue: Secondary | ICD-10-CM

## 2016-12-31 NOTE — Progress Notes (Signed)
Patient ID: Jerry Hire, MD, male   DOB: 1956/03/03, 61 y.o.   MRN: [id number]  Chief Complaint  Patient presents with  . Routine Post Op    hernia    HPI Jerry Hire, MD is a 61 y.o. male here today for post op hernia repair done on 09/15/2016. The patient called to report that he is had discomfort along the incision since surgery. This is located to the incision without radiation along the course of the multiple nerves in the area. This is most notable with periods of sitting over 2 hours. He has no discomfort during vigorous strenuous activity such as carrying firewood. He is made use of anti-inflammatories with some moderation over the last several months but still enough discomfort to warrant reassessment.  No difficulty with bowel or bladder function.  No history of fever or chills.   HPI  Past Medical History:  Diagnosis Date  . Adopted   . Central retinal vein occlusion   . GERD (gastroesophageal reflux disease)   . Glaucoma   . HTN (hypertension)   . Hyperlipidemia   . MGUS (monoclonal gammopathy of unknown significance)   . Pilonidal cyst   . URI (upper respiratory infection) 09/09/2016   STARTED ON LEVAQUIN BY PCP    Past Surgical History:  Procedure Laterality Date  . COLONOSCOPY  2009   Dr Vira Agar  . INGUINAL HERNIA REPAIR Right 1996  . INGUINAL HERNIA REPAIR Left 09/15/2016   Procedure: HERNIA REPAIR INGUINAL ADULT;  Surgeon: Robert Bellow, MD;  Location: [facility] ORS;  Service: General;  Laterality: Left;  . TONSILLECTOMY    . UPPER GI ENDOSCOPY  2009   Dr Vira Agar    Family History  Problem Relation Age of Onset  . Adopted: Yes    Social History Social History  Substance Use Topics  . Smoking status: Never Smoker  . Smokeless tobacco: Never Used  . Alcohol use 0.6 oz/week    1 Standard drinks or equivalent per week     Comment: 1 wine daily    No Known Allergies  Current Outpatient Prescriptions  Medication Sig Dispense Refill  .  atorvastatin (LIPITOR) 10 MG tablet Take 10 mg by mouth every morning.     . hydrochlorothiazide (MICROZIDE) 12.5 MG capsule Take 12.5 mg by mouth daily.   4  . losartan (COZAAR) 25 MG tablet Take 25 mg by mouth every morning.   4  . Omeprazole (PRILOSEC PO) Take 20 mg by mouth daily as needed (heartburn).     . timolol (BETIMOL) 0.25 % ophthalmic solution Place 1 drop into both eyes daily.      No current facility-administered medications for this visit.     Review of Systems Review of Systems  Constitutional: Negative.   Respiratory: Negative.   Cardiovascular: Negative.     Blood pressure 124/80, pulse 74, resp. rate 12, height 6' (1.829 m), weight 173 lb (78.5 kg).  Physical Exam Physical Exam  Genitourinary:       Data Reviewed Hernia repair making use of Bard PerFix plug, excision of lipoma of the cord.  Assessment    Incisional discomfort, unlikely related to underlying mesh.    Plan    It was elected to make use of a trial of dexamethasone, 4 mg as well as 1 mL of 1% Xylocaine with 1-100,000 units of epinephrine and 1 mL of 0.5% Marcaine. The patient will give a phone report in 5 days. As the patient has not noted study  improvement over the last month with the use of anti-inflammatories these may be discontinued. As this appears to be a pressure sensation rather than nerve root irritation if symptoms persist would give consideration to a CT or MR of the area to see if there is no inflammatory process around the mesh      Robert Bellow 12/31/2016, 3:15 PM

## 2017-01-05 ENCOUNTER — Telehealth: Payer: Self-pay | Admitting: General Surgery

## 2017-01-05 ENCOUNTER — Other Ambulatory Visit: Payer: Self-pay | Admitting: *Deleted

## 2017-01-05 DIAGNOSIS — L7682 Other postprocedural complications of skin and subcutaneous tissue: Secondary | ICD-10-CM

## 2017-01-05 DIAGNOSIS — K4090 Unilateral inguinal hernia, without obstruction or gangrene, not specified as recurrent: Secondary | ICD-10-CM

## 2017-01-05 MED ORDER — ACETAMINOPHEN-CODEINE #3 300-30 MG PO TABS: 1.0000 | ORAL_TABLET | Freq: Every evening | ORAL | 0 refills | Status: DC | PRN

## 2017-01-05 NOTE — Telephone Encounter (Signed)
Patient notified and picked up prescription.

## 2017-01-05 NOTE — Telephone Encounter (Signed)
No relieve with steroid injection. Pain worse lasting for hours after sitting > 2 hours.  Will arrange for CT of the pelvis.  Pain interfering with sleep, no response to non-steroidals.  Norco produced hyper activity.  Will rx with Tylenol #3, 1 po qhs for nocturnal pain pending CT and follow up after return from [place].

## 2017-01-05 NOTE — Telephone Encounter (Signed)
Patient has been scheduled for a CT pelvis with contrast at [facility] for 02-07-17 at 8:45 am. Prep: NPO 4 hours prior and pick up prep kit.   This patient will be notified of the above when he stops by to pick up prescription.

## 2017-01-07 ENCOUNTER — Ambulatory Visit
Admission: RE | Admit: 2017-01-07 | Discharge: 2017-01-07 | Disposition: A | Discharge status: Home / Self Care | Payer: BLUE CROSS/BLUE SHIELD | Source: Ambulatory Visit | Attending: General Surgery | Admitting: General Surgery

## 2017-01-07 DIAGNOSIS — N400 Enlarged prostate without lower urinary tract symptoms: Secondary | ICD-10-CM | POA: Diagnosis not present

## 2017-01-07 DIAGNOSIS — I70208 Unspecified atherosclerosis of native arteries of extremities, other extremity: Secondary | ICD-10-CM | POA: Diagnosis not present

## 2017-01-07 DIAGNOSIS — K4090 Unilateral inguinal hernia, without obstruction or gangrene, not specified as recurrent: Secondary | ICD-10-CM

## 2017-01-07 DIAGNOSIS — K91873 Postprocedural seroma of a digestive system organ or structure following other procedure: Secondary | ICD-10-CM | POA: Insufficient documentation

## 2017-01-07 DIAGNOSIS — R208 Other disturbances of skin sensation: Secondary | ICD-10-CM | POA: Insufficient documentation

## 2017-01-07 DIAGNOSIS — L7682 Other postprocedural complications of skin and subcutaneous tissue: Secondary | ICD-10-CM

## 2017-01-07 MED ORDER — IOPAMIDOL (ISOVUE-300) INJECTION 61%
100.0000 mL | Freq: Once | INTRAVENOUS | Status: AC | PRN
  Administered 2017-01-07: 100 mL via INTRAVENOUS

## 2017-02-16 ENCOUNTER — Encounter: Payer: Self-pay | Admitting: General Surgery

## 2017-03-06 ENCOUNTER — Other Ambulatory Visit: Payer: Self-pay

## 2017-03-06 ENCOUNTER — Telehealth: Payer: Self-pay

## 2017-03-06 MED ORDER — PREDNISONE 10 MG (21) PO TBPK: ORAL_TABLET | ORAL | 0 refills | Status: DC

## 2017-03-06 NOTE — Telephone Encounter (Signed)
Patient notified of prescription and will follow up here in the office on 03/12/17 at 11:15 am.

## 2017-03-06 NOTE — Telephone Encounter (Signed)
-----   Message from Robert Bellow, MD sent at 03/06/2017 11:55 AM EDT ----- Please send a prescription to [facility] on [address] for prednisone, 10 mg tablets, #21.  Six to start, decrease by one/ day. Arrange a time for him to come in on Thursday, f/u post op hernia pain. Thanks

## 2017-03-10 DIAGNOSIS — K4090 Unilateral inguinal hernia, without obstruction or gangrene, not specified as recurrent: Secondary | ICD-10-CM

## 2017-03-12 ENCOUNTER — Encounter: Payer: Self-pay | Admitting: General Surgery

## 2017-03-12 ENCOUNTER — Ambulatory Visit (INDEPENDENT_AMBULATORY_CARE_PROVIDER_SITE_OTHER): Payer: BLUE CROSS/BLUE SHIELD | Admitting: General Surgery

## 2017-03-12 ENCOUNTER — Inpatient Hospital Stay: Payer: Self-pay

## 2017-03-12 VITALS — BP 124/82 | HR 62 | Resp 12 | Ht 72.0 in | Wt 174.0 lb

## 2017-03-12 DIAGNOSIS — L7682 Other postprocedural complications of skin and subcutaneous tissue: Secondary | ICD-10-CM

## 2017-03-12 MED ORDER — ACETAMINOPHEN-CODEINE #3 300-30 MG PO TABS: 1.0000 | ORAL_TABLET | ORAL | 0 refills | Status: DC | PRN

## 2017-03-12 NOTE — Patient Instructions (Signed)
The patient is aware to call back for any questions or concerns.  

## 2017-03-12 NOTE — Progress Notes (Addendum)
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   MRN: [id number]  Chief Complaint  Patient presents with  . 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  Here today for postoperative visit, he states overall he is doing well. Finishes the prednisone today. He is still having significant discomfort with long periods of sitting. Denies any gastrointestinal issues, bowels are moving regular. The patient had previously undergone a dexamethasone injection at the lateral scar edge without improvement. A recent course of oral prednisone taper prior to a planned car trip did not produce any significant relief and was tolerated with minimal side effect.  HPI  Past Medical History:  Diagnosis Date  . Adopted   . Central retinal vein occlusion   . GERD (gastroesophageal reflux disease)   . Glaucoma   . HTN (hypertension)   . Hyperlipidemia   . MGUS (monoclonal gammopathy of unknown significance)   . Pilonidal cyst   . URI (upper respiratory infection) 09/09/2016   STARTED ON LEVAQUIN BY PCP    Past Surgical History:  Procedure Laterality Date  . COLONOSCOPY  2009   Dr Vira Agar  . INGUINAL HERNIA REPAIR Right 1996  . INGUINAL HERNIA REPAIR Left 09/15/2016   Procedure: HERNIA REPAIR INGUINAL ADULT;  Surgeon: Robert Bellow, MD;  Location: [facility] ORS;  Service: General;  Laterality: Left;  . TONSILLECTOMY    . UPPER GI ENDOSCOPY  2009   Dr Vira Agar    Family History  Problem Relation Age of Onset  . Adopted: Yes    Social History Social History  Substance Use Topics  . Smoking status: Never Smoker  . Smokeless tobacco: Never Used  . Alcohol use 0.6 oz/week    1 Standard drinks or equivalent per week     Comment: 1 wine daily    No Known Allergies  Current Outpatient Prescriptions  Medication Sig Dispense Refill  . acetaminophen-codeine (TYLENOL #3) 300-30 MG tablet Take 1 tablet by mouth every 4 (four) hours as needed for moderate pain. 30  tablet 0  . atorvastatin (LIPITOR) 10 MG tablet Take 10 mg by mouth every morning.     . hydrochlorothiazide (MICROZIDE) 12.5 MG capsule Take 12.5 mg by mouth daily.   4  . losartan (COZAAR) 25 MG tablet Take 25 mg by mouth every morning.   4  . Omeprazole (PRILOSEC PO) Take 20 mg by mouth daily as needed (heartburn).     . predniSONE (STERAPRED UNI-PAK 21 TAB) 10 MG (21) TBPK tablet Take 6 the first day and decrease by one for every day afterwards 21 tablet 0  . timolol (BETIMOL) 0.25 % ophthalmic solution Place 1 drop into both eyes daily.      No current facility-administered medications for this visit.     Review of Systems Review of Systems  Constitutional: Negative.   Respiratory: Negative.   Cardiovascular: Negative.     Blood pressure 124/82, pulse 62, resp. rate 12, height 6' (1.829 m), weight 174 lb (78.9 kg), SpO2 99 %.  Physical Exam Physical Exam  Constitutional: He appears well-developed and well-nourished.  Neck: Neck supple.  Cardiovascular: Normal rate and regular rhythm.   Pulmonary/Chest: Effort normal and breath sounds normal.  Genitourinary:       Data Reviewed CT of the pelvis dated 01/07/2017 suggested a small postoperative seroma between the lower rectus sheath and bladder. No superficial fluid collections.  Ultrasound completed today to assess for  a superficial process due to his hypersensitivity was notable for a 1.2 x 1.24 x 1.3 cm hypoechoic area approximately 3 cm deep to the skin near the rectus fascia. This could correspond with the "seroma" noted above on the CT scan and likely represents inflammatory changes around the "plug" placed at the time of surgical repair of the direct hernia.  Assessment    Persistent postsurgical pain.    Plan    Options for management were reviewed: 1) second surgical opinion versus 2) pain clinic referral versus 3) sees mesh excision with possible neurorrhaphy, or neurorrhaphy alone if a significant neuroma is  identified with possible placement of a nonabsorbable mesh to minimize the risk for recurrent hernia repair versus non-mesh repair.  The risks of recurrent groin exploration were reviewed and the likelihood of permanent numbness if neurorrhaphy is completed was discussed.       Robert Bellow 03/14/2017, 7:59 AM  Patient's surgery has been scheduled for 04-01-17 at [facility].   Dominga Ferry, CMA

## 2017-03-25 ENCOUNTER — Encounter
Admission: RE | Admit: 2017-03-25 | Discharge: 2017-03-25 | Disposition: A | Discharge status: Home / Self Care | Payer: BLUE CROSS/BLUE SHIELD | Source: Ambulatory Visit | Attending: General Surgery | Admitting: General Surgery

## 2017-03-25 DIAGNOSIS — R1030 Lower abdominal pain, unspecified: Secondary | ICD-10-CM | POA: Insufficient documentation

## 2017-03-25 DIAGNOSIS — Z01812 Encounter for preprocedural laboratory examination: Secondary | ICD-10-CM | POA: Insufficient documentation

## 2017-03-25 NOTE — Patient Instructions (Signed)
  Your procedure is scheduled on: 04-01-17 [facility] Report to Same Day Surgery 2nd floor medical mall [facility] Entrance-take elevator on left to 2nd floor.  Check in with surgery information desk.) To find out your arrival time please call [phone] between 1PM - 3PM on 03-31-17 TUESDAY  Remember: Instructions that are not followed completely may result in serious medical risk, up to and including death, or upon the discretion of your surgeon and anesthesiologist your surgery may need to be rescheduled.    _x___ 1. Do not eat food or drink liquids after midnight. No gum chewing or hard candies.     __x__ 2. No Alcohol for 24 hours before or after surgery.   __x__3. No Smoking for 24 prior to surgery.   ____  4. Bring all medications with you on the day of surgery if instructed.    __x__ 5. Notify your doctor if there is any change in your medical condition     (cold, fever, infections).     Do not wear jewelry, make-up, hairpins, clips or nail polish.  Do not wear lotions, powders, or perfumes. You may wear deodorant.  Do not shave 48 hours prior to surgery. Men may shave face and neck.  Do not bring valuables to the hospital.    [facility] is not responsible for any belongings or valuables.               Contacts, dentures or bridgework may not be worn into surgery.  Leave your suitcase in the car. After surgery it may be brought to your room.  For patients admitted to the hospital, discharge time is determined by your treatment team.   Patients discharged the day of surgery will not be allowed to drive home.  You will need someone to drive you home and stay with you the night of your procedure.    Please read over the following fact sheets that you were given:     _x___ [place] WITH A SMALL SIP OF WATER. These include:  1. LIPITOR  2. LOSARTAN  3. PRILOSEC  4. TAKE A PRILOSEC ON Tuesday NIGHT BEFORE BED  (03-31-17)  5.  6.  ____Fleets enema or Magnesium Citrate as directed.   _x___ Use CHG Soap or sage wipes as directed on instruction sheet   ____ Use inhalers on the day of surgery and bring to hospital day of surgery  ____ Stop Metformin and Janumet 2 days prior to surgery.    ____ Take 1/2 of usual insulin dose the night before surgery and none on the morning surgery.   ____ Follow recommendations from Cardiologist, Pulmonologist or PCP regarding stopping Aspirin, Coumadin, Pllavix ,Eliquis, Effient, or Pradaxa, and Pletal.  _X___Stop Anti-inflammatories such as Advil, Aleve, Ibuprofen, Motrin, Naproxen, Naprosyn, Goodies powders or aspirin products. OK to take Tylenol # 3 PRN   ____ Stop supplements until after surgery.    ____ Bring C-Pap to the hospital.

## 2017-03-26 ENCOUNTER — Telehealth: Payer: Self-pay | Admitting: *Deleted

## 2017-03-26 ENCOUNTER — Encounter
Admission: RE | Admit: 2017-03-26 | Discharge: 2017-03-26 | Disposition: A | Discharge status: Home / Self Care | Payer: BLUE CROSS/BLUE SHIELD | Source: Ambulatory Visit | Attending: General Surgery | Admitting: General Surgery

## 2017-03-26 DIAGNOSIS — Z01812 Encounter for preprocedural laboratory examination: Secondary | ICD-10-CM | POA: Diagnosis not present

## 2017-03-26 DIAGNOSIS — R1030 Lower abdominal pain, unspecified: Secondary | ICD-10-CM | POA: Diagnosis not present

## 2017-03-26 LAB — POTASSIUM: Potassium: 3.4 mmol/L — ABNORMAL LOW (ref 3.5–5.1)

## 2017-03-26 NOTE — Telephone Encounter (Signed)
Patient's recent labs done on 03-26-17 at [facility] showed a low potassium of 3.4.   Dr. Bary Castilla notified.   A prescription of potassium chloride tablets 20 meq one PO daily #10 no refills has been called in to [facility] per patient's request.   Patient aware and verbalizes understanding.

## 2017-03-31 ENCOUNTER — Encounter: Payer: Self-pay | Admitting: *Deleted

## 2017-04-01 ENCOUNTER — Encounter: Payer: Self-pay | Admitting: *Deleted

## 2017-04-01 ENCOUNTER — Encounter
Admission: RE | Disposition: A | Discharge status: Home / Self Care | Payer: Self-pay | Source: Ambulatory Visit | Attending: General Surgery

## 2017-04-01 ENCOUNTER — Ambulatory Visit: Payer: BLUE CROSS/BLUE SHIELD | Admitting: Certified Registered"

## 2017-04-01 ENCOUNTER — Ambulatory Visit
Admission: RE | Admit: 2017-04-01 | Discharge: 2017-04-01 | Disposition: A | Discharge status: Home / Self Care | Payer: BLUE CROSS/BLUE SHIELD | Source: Ambulatory Visit | Attending: General Surgery | Admitting: General Surgery

## 2017-04-01 DIAGNOSIS — I10 Essential (primary) hypertension: Secondary | ICD-10-CM | POA: Diagnosis not present

## 2017-04-01 DIAGNOSIS — G8929 Other chronic pain: Secondary | ICD-10-CM | POA: Diagnosis not present

## 2017-04-01 DIAGNOSIS — Z79899 Other long term (current) drug therapy: Secondary | ICD-10-CM | POA: Diagnosis not present

## 2017-04-01 DIAGNOSIS — T85848A Pain due to other internal prosthetic devices, implants and grafts, initial encounter: Secondary | ICD-10-CM | POA: Insufficient documentation

## 2017-04-01 DIAGNOSIS — L7682 Other postprocedural complications of skin and subcutaneous tissue: Secondary | ICD-10-CM

## 2017-04-01 DIAGNOSIS — K219 Gastro-esophageal reflux disease without esophagitis: Secondary | ICD-10-CM | POA: Insufficient documentation

## 2017-04-01 DIAGNOSIS — Y813 Surgical instruments, materials and general- and plastic-surgery devices (including sutures) associated with adverse incidents: Secondary | ICD-10-CM | POA: Insufficient documentation

## 2017-04-01 HISTORY — PX: EXCISION OF MESH: SHX6268

## 2017-04-01 SURGERY — REMOVAL, MESH, ABDOMEN OR PELVIS
Anesthesia: General | Site: Abdomen | Laterality: Left | Wound class: Clean

## 2017-04-01 MED ORDER — LACTATED RINGERS IV SOLN
INTRAVENOUS | Status: DC
  Administered 2017-04-01: 14:00:00 via INTRAVENOUS

## 2017-04-01 MED ORDER — CEFAZOLIN SODIUM-DEXTROSE 2-4 GM/100ML-% IV SOLN
2.0000 g | INTRAVENOUS | Status: AC
  Administered 2017-04-01: 2 g via INTRAVENOUS

## 2017-04-01 MED ORDER — ROCURONIUM BROMIDE 50 MG/5ML IV SOLN
INTRAVENOUS | Status: AC
  Filled 2017-04-01: qty 1

## 2017-04-01 MED ORDER — MIDAZOLAM HCL 2 MG/2ML IJ SOLN
INTRAMUSCULAR | Status: AC
  Filled 2017-04-01: qty 2

## 2017-04-01 MED ORDER — BUPIVACAINE-EPINEPHRINE (PF) 0.5% -1:200000 IJ SOLN
INTRAMUSCULAR | Status: DC | PRN
  Administered 2017-04-01: 10 mL
  Administered 2017-04-01: 20 mL

## 2017-04-01 MED ORDER — OXYCODONE-ACETAMINOPHEN 5-325 MG PO TABS
1.0000 | ORAL_TABLET | ORAL | Status: DC | PRN
  Administered 2017-04-01: 1 via ORAL

## 2017-04-01 MED ORDER — ACETAMINOPHEN 10 MG/ML IV SOLN
INTRAVENOUS | Status: DC | PRN
Start: 2017-04-01 — End: 2017-04-01
  Administered 2017-04-01: 1000 mg via INTRAVENOUS

## 2017-04-01 MED ORDER — SUGAMMADEX SODIUM 200 MG/2ML IV SOLN
INTRAVENOUS | Status: DC | PRN
  Administered 2017-04-01: 160 mg via INTRAVENOUS

## 2017-04-01 MED ORDER — FENTANYL CITRATE (PF) 100 MCG/2ML IJ SOLN
INTRAMUSCULAR | Status: AC
  Filled 2017-04-01: qty 2

## 2017-04-01 MED ORDER — LIDOCAINE HCL (CARDIAC) 20 MG/ML IV SOLN
INTRAVENOUS | Status: DC | PRN
  Administered 2017-04-01: 50 mg via INTRAVENOUS

## 2017-04-01 MED ORDER — ROCURONIUM BROMIDE 100 MG/10ML IV SOLN
INTRAVENOUS | Status: DC | PRN
  Administered 2017-04-01: 40 mg via INTRAVENOUS
  Administered 2017-04-01: 5 mg via INTRAVENOUS

## 2017-04-01 MED ORDER — SUCCINYLCHOLINE CHLORIDE 20 MG/ML IJ SOLN
INTRAMUSCULAR | Status: AC
  Filled 2017-04-01: qty 1

## 2017-04-01 MED ORDER — BUPIVACAINE-EPINEPHRINE (PF) 0.5% -1:200000 IJ SOLN
INTRAMUSCULAR | Status: AC
  Filled 2017-04-01: qty 30

## 2017-04-01 MED ORDER — SUGAMMADEX SODIUM 200 MG/2ML IV SOLN
INTRAVENOUS | Status: AC
  Filled 2017-04-01: qty 2

## 2017-04-01 MED ORDER — MIDAZOLAM HCL 2 MG/2ML IJ SOLN
INTRAMUSCULAR | Status: DC | PRN
  Administered 2017-04-01: 2 mg via INTRAVENOUS

## 2017-04-01 MED ORDER — SUCCINYLCHOLINE CHLORIDE 20 MG/ML IJ SOLN
INTRAMUSCULAR | Status: DC | PRN
  Administered 2017-04-01: 90 mg via INTRAVENOUS
  Administered 2017-04-01: 80 mg via INTRAVENOUS

## 2017-04-01 MED ORDER — PROPOFOL 10 MG/ML IV BOLUS
INTRAVENOUS | Status: AC
  Filled 2017-04-01: qty 20

## 2017-04-01 MED ORDER — KETOROLAC TROMETHAMINE 30 MG/ML IJ SOLN
INTRAMUSCULAR | Status: AC
  Filled 2017-04-01: qty 1

## 2017-04-01 MED ORDER — FENTANYL CITRATE (PF) 100 MCG/2ML IJ SOLN
INTRAMUSCULAR | Status: DC | PRN
  Administered 2017-04-01: 100 ug via INTRAVENOUS

## 2017-04-01 MED ORDER — OXYCODONE-ACETAMINOPHEN 5-325 MG PO TABS: 1.0000 | ORAL_TABLET | ORAL | 0 refills | Status: DC | PRN

## 2017-04-01 MED ORDER — FENTANYL CITRATE (PF) 100 MCG/2ML IJ SOLN
25.0000 ug | INTRAMUSCULAR | Status: AC | PRN
  Administered 2017-04-01 (×6): 25 ug via INTRAVENOUS

## 2017-04-01 MED ORDER — CEFAZOLIN SODIUM-DEXTROSE 2-4 GM/100ML-% IV SOLN
INTRAVENOUS | Status: AC
  Filled 2017-04-01: qty 100

## 2017-04-01 MED ORDER — LIDOCAINE HCL (PF) 2 % IJ SOLN
INTRAMUSCULAR | Status: AC
  Filled 2017-04-01: qty 2

## 2017-04-01 MED ORDER — ONDANSETRON HCL 4 MG/2ML IJ SOLN: 4.0000 mg | Freq: Once | INTRAMUSCULAR | Status: DC | PRN

## 2017-04-01 MED ORDER — ONDANSETRON HCL 4 MG/2ML IJ SOLN
INTRAMUSCULAR | Status: DC | PRN
  Administered 2017-04-01: 4 mg via INTRAVENOUS

## 2017-04-01 MED ORDER — OXYCODONE-ACETAMINOPHEN 5-325 MG PO TABS
ORAL_TABLET | ORAL | Status: AC
  Filled 2017-04-01: qty 1

## 2017-04-01 MED ORDER — DEXAMETHASONE SODIUM PHOSPHATE 10 MG/ML IJ SOLN
INTRAMUSCULAR | Status: DC | PRN
Start: 2017-04-01 — End: 2017-04-01
  Administered 2017-04-01: 5 mg via INTRAVENOUS

## 2017-04-01 MED ORDER — PROPOFOL 10 MG/ML IV BOLUS
INTRAVENOUS | Status: DC | PRN
  Administered 2017-04-01: 160 mg via INTRAVENOUS
  Administered 2017-04-01: 40 mg via INTRAVENOUS

## 2017-04-01 SURGICAL SUPPLY — 32 items
BLADE SURG 15 STRL SS SAFETY (BLADE) ×4 IMPLANT
CANISTER SUCT 1200ML W/VALVE (MISCELLANEOUS) ×2 IMPLANT
CHLORAPREP W/TINT 26ML (MISCELLANEOUS) ×2 IMPLANT
DECANTER SPIKE VIAL GLASS SM (MISCELLANEOUS) ×2 IMPLANT
DRAIN PENROSE 1/4X12 LTX (DRAIN) ×2 IMPLANT
DRAPE LAPAROTOMY 100X77 ABD (DRAPES) ×2 IMPLANT
DRSG TEGADERM 4X4.75 (GAUZE/BANDAGES/DRESSINGS) ×2 IMPLANT
DRSG TELFA 4X3 1S NADH ST (GAUZE/BANDAGES/DRESSINGS) ×2 IMPLANT
ELECT REM PT RETURN 9FT ADLT (ELECTROSURGICAL) ×2
ELECTRODE REM PT RTRN 9FT ADLT (ELECTROSURGICAL) ×1 IMPLANT
GLOVE BIO SURGEON STRL SZ7.5 (GLOVE) ×2 IMPLANT
GLOVE INDICATOR 8.0 STRL GRN (GLOVE) ×2 IMPLANT
GOWN STRL REUS W/ TWL LRG LVL3 (GOWN DISPOSABLE) ×2 IMPLANT
GOWN STRL REUS W/TWL LRG LVL3 (GOWN DISPOSABLE) ×2
KIT RM TURNOVER STRD PROC AR (KITS) ×2 IMPLANT
LABEL OR SOLS (LABEL) ×2 IMPLANT
NDL SAFETY 22GX1.5 (NEEDLE) ×4 IMPLANT
NEEDLE HYPO 25X1 1.5 SAFETY (NEEDLE) ×2 IMPLANT
PACK BASIN MINOR ARMC (MISCELLANEOUS) ×2 IMPLANT
SPONGE KITTNER 5P (MISCELLANEOUS) ×2 IMPLANT
STRIP CLOSURE SKIN 1/2X4 (GAUZE/BANDAGES/DRESSINGS) ×2 IMPLANT
SUT SURGILON 0 BLK (SUTURE) ×2 IMPLANT
SUT VIC AB 2-0 SH 27 (SUTURE) ×2
SUT VIC AB 2-0 SH 27XBRD (SUTURE) ×1 IMPLANT
SUT VIC AB 3-0 54X BRD REEL (SUTURE) ×1 IMPLANT
SUT VIC AB 3-0 BRD 54 (SUTURE) ×1
SUT VIC AB 3-0 SH 27 (SUTURE) ×1
SUT VIC AB 3-0 SH 27X BRD (SUTURE) ×1 IMPLANT
SUT VIC AB 4-0 FS2 27 (SUTURE) ×2 IMPLANT
SWABSTK COMLB BENZOIN TINCTURE (MISCELLANEOUS) ×2 IMPLANT
SYR 3ML LL SCALE MARK (SYRINGE) ×2 IMPLANT
SYR CONTROL 10ML (SYRINGE) ×4 IMPLANT

## 2017-04-01 NOTE — H&P (Signed)
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 CT scan showed a 2-1/2-3 cm area thought to represent a "seroma" at the site of the erect defect. The patient's failed local as well as oral corticosteroid therapy and is admitted for planned excision wound exploration.  If no other pathology is identified the plug will be removed and if the mesh shows no unusual inflammation the onlay component will be left in situ. If there is evidence of a neuroma neurorrhaphy will be completed.

## 2017-04-01 NOTE — OR Nursing (Signed)
Dr Bary Castilla by to see pt and signed rx 1700

## 2017-04-01 NOTE — OR Nursing (Signed)
Explanted mesh from left abdomen

## 2017-04-01 NOTE — Discharge Instructions (Signed)

## 2017-04-01 NOTE — Transfer of Care (Signed)
Immediate Anesthesia Transfer of Care Note  Patient: Jerry Hire, MD  Procedure(s) Performed: Procedure(s): REMOVAL MESH PLUG AND NEURORRHAPHY (Left)  Patient Location: PACU  Anesthesia Type:General  Level of Consciousness: awake and patient cooperative  Airway & Oxygen Therapy: Patient Spontanous Breathing and Patient connected to face mask oxygen  Post-op Assessment: Report given to RN and Post -op Vital signs reviewed and stable  Post vital signs: Reviewed and stable  Last Vitals:  Vitals:   04/01/17 1307 04/01/17 1545  BP: (!) 135/95 131/84  Pulse: 63 66  Resp: 12 15  Temp: 36.9 C 36.2 C    Last Pain:  Vitals:   04/01/17 1545  TempSrc:   PainSc: 0-No pain         Complications: No apparent anesthesia complications

## 2017-04-01 NOTE — Anesthesia Procedure Notes (Signed)
Procedure Name: Intubation Performed by: Lance Muss Pre-anesthesia Checklist: Patient identified, Patient being monitored, Timeout performed, Emergency Drugs available and Suction available Patient Re-evaluated:Patient Re-evaluated prior to inductionOxygen Delivery Method: Circle system utilized Preoxygenation: Pre-oxygenation with 100% oxygen Intubation Type: IV induction Ventilation: Mask ventilation without difficulty and Oral airway inserted - appropriate to patient size Laryngoscope Size: 4 and McGraph Grade View: Grade III Tube type: Oral Tube size: 7.5 mm Number of attempts: 1 Airway Equipment and Method: Stylet,  Video-laryngoscopy and Bougie stylet Placement Confirmation: ETT inserted through vocal cords under direct vision,  positive ETCO2 and breath sounds checked- equal and bilateral Secured at: 21 cm Tube secured with: Tape Dental Injury: Teeth and Oropharynx as per pre-operative assessment  Difficulty Due To: Difficult Airway- due to anterior larynx Future Recommendations: Recommend- induction with short-acting agent, and alternative techniques readily available Comments: First DL with MAC 4 had grade III view, attempted to pass ETT through bougie and was unsuccessful. Second attempt with Mcgraph 4 with grade I view of cords. Placed ETT successfully with +BBS, +ETCO2. Recommend using videolarygnscopy first if possible.

## 2017-04-01 NOTE — Op Note (Signed)
Preoperative diagnosis: Chronic pain status post left indirect inguinal hernia repair with a Bard PerFix plug.  Postoperative diagnosis: Same.  Operative procedure: Left groin exploration with explant of PerFix plug, neurorrhaphy of the ileo-inguinal nerve.  Operating surgeon: Ollen Bowl, M.D.  Anesthesia: Gen. endotracheal, Marcaine 0.5% with 1-200,000 of epinephrine, 30 mL. Toradol 30 mg.  Estimated blood loss: Less than 5 mL.  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 He has had persistent pain at surgical site especially with long periods of sitting. He is failed to improve with a course of local  cortisone injection or a short course of oral steroid therapy. CT scan showed a suspected seroma which was thought likely to represent the plug. He is brought to the operative for planned explant of the plug and/or onlay mesh with neural lysis if indicated. The patient received prior the procedure. SCD stockings for DVT prevention.  Operative note: There was removed and the surgical site. The skin of the lower abdomen was cleansed with ChloraPrep and draped. Field block anesthesia was established with the above-mentioned local anesthetic. The original incision was opened. The area was dissected down to the external oblique fascia. This was opened with cautery. Moderate scarring was noted. The cord was mobilized and controlled with a Penrose drain. The onlay mesh appeared intact and smooth. The iliohypogastric nerve was ligated to provide better exposure. The superior mesh sutures were removed down to the pubic tubercle. This allowed the mesh to rotate out of the way and the sutures holding the previously placed PerFix plug were exposed. The plug was then gently mobilized using blunt dissection. This was a 1.5 x 2 cm firm rubbery mass and thought to be accounting for the  patient's pressure during hip flexion and corresponded to the CT findings. Kermit Balo hemostasis was noted. The onlay mesh was then resutured to the transverse abdominis aponeurosis with interrupted 0 Surgilon sutures going down to the pubic tubercle. Toradol was placed in the wound. The vas and vessels were returned to their bed. The external plica was closed with a running 2-0 Vicryl suture. Scarpa's fascia was closed with a running 3-0 suture. Skin was closed with a running 4-0 Vicryl septic suture.  Benzoin, Steri-Strips, Telfa and Tegaderm dressing applied.  The patient tolerated the procedure well and was taken recovery room in stable condition.

## 2017-04-01 NOTE — Anesthesia Postprocedure Evaluation (Signed)
Anesthesia Post Note  Patient: Jerry Hire, MD  Procedure(s) Performed: Procedure(s) (LRB): REMOVAL MESH PLUG AND NEURORRHAPHY (Left)  Patient location during evaluation: PACU Anesthesia Type: General Level of consciousness: awake and alert and oriented Pain management: pain level controlled Vital Signs Assessment: post-procedure vital signs reviewed and stable Respiratory status: spontaneous breathing, nonlabored ventilation and respiratory function stable Cardiovascular status: blood pressure returned to baseline and stable Postop Assessment: no signs of nausea or vomiting Anesthetic complications: no     Last Vitals:  Vitals:   04/01/17 1630 04/01/17 1649  BP: (!) 144/91 132/90  Pulse: 61 60  Resp: 17 16  Temp:  36.8 C    Last Pain:  Vitals:   04/01/17 1649  TempSrc: Tympanic  PainSc: 3                  Jerry Rosales

## 2017-04-01 NOTE — OR Nursing (Signed)
Per PACU RN, pt may be d/c'd to home without visit from Dr. Bary Castilla.

## 2017-04-01 NOTE — Anesthesia Preprocedure Evaluation (Signed)
Anesthesia Evaluation  Patient identified by MRN, date of birth, ID band Patient awake    Reviewed: Allergy & Precautions, NPO status , Patient's Chart, lab work & pertinent test results  Airway Mallampati: II       Dental  (+) Teeth Intact   Pulmonary neg pulmonary ROS,    breath sounds clear to auscultation       Cardiovascular Exercise Tolerance: Good hypertension, Pt. on medications  Rhythm:Regular Rate:Normal     Neuro/Psych negative neurological ROS     GI/Hepatic Neg liver ROS, GERD  Medicated,  Endo/Other  negative endocrine ROS  Renal/GU negative Renal ROS     Musculoskeletal negative musculoskeletal ROS (+)   Abdominal Normal abdominal exam  (+)   Peds  Hematology negative hematology ROS (+)   Anesthesia Other Findings   Reproductive/Obstetrics                             Anesthesia Physical Anesthesia Plan  ASA: II  Anesthesia Plan: General   Post-op Pain Management:    Induction: Intravenous  PONV Risk Score and Plan: 1 and Ondansetron and Treatment may vary due to age  Airway Management Planned:   Additional Equipment:   Intra-op Plan:   Post-operative Plan: Extubation in OR  Informed Consent: I have reviewed the patients History and Physical, chart, labs and discussed the procedure including the risks, benefits and alternatives for the proposed anesthesia with the patient or authorized representative who has indicated his/her understanding and acceptance.     Plan Discussed with: CRNA  Anesthesia Plan Comments:         Anesthesia Quick Evaluation

## 2017-04-01 NOTE — Anesthesia Post-op Follow-up Note (Cosign Needed)
Anesthesia QCDR form completed.        

## 2017-04-02 ENCOUNTER — Encounter: Payer: Self-pay | Admitting: General Surgery

## 2017-04-08 ENCOUNTER — Encounter: Payer: Self-pay | Admitting: General Surgery

## 2017-04-08 ENCOUNTER — Ambulatory Visit (INDEPENDENT_AMBULATORY_CARE_PROVIDER_SITE_OTHER): Payer: BLUE CROSS/BLUE SHIELD | Admitting: General Surgery

## 2017-04-08 VITALS — BP 130/78 | HR 69 | Resp 12 | Ht 72.0 in | Wt 161.0 lb

## 2017-04-08 DIAGNOSIS — K4090 Unilateral inguinal hernia, without obstruction or gangrene, not specified as recurrent: Secondary | ICD-10-CM

## 2017-04-08 NOTE — Progress Notes (Signed)
Patient ID: Jerry Hire, MD, male   DOB: 09-08-1956, 61 y.o.   MRN: [id number]  Chief Complaint  Patient presents with  . 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 Patient states he started having right back problems two days after surgery. Patient has been using heat and ice on his back.   Jerry Rosales, wife is present  at visit.  HPI  Past Medical History:  Diagnosis Date  . Adopted   . Central retinal vein occlusion   . GERD (gastroesophageal reflux disease)   . Glaucoma   . HTN (hypertension)   . Hyperlipidemia   . MGUS (monoclonal gammopathy of unknown significance)   . Pilonidal cyst   . URI (upper respiratory infection) 09/09/2016   STARTED ON LEVAQUIN BY PCP    Past Surgical History:  Procedure Laterality Date  . COLONOSCOPY  2009   Dr Vira Agar  . EXCISION OF MESH Left 04/01/2017   Procedure: REMOVAL MESH PLUG AND NEURORRHAPHY;  Surgeon: Robert Bellow, MD;  Location: [facility] ORS;  Service: General;  Laterality: Left;  . INGUINAL HERNIA REPAIR Right 1996  . INGUINAL HERNIA REPAIR Left 09/15/2016   Procedure: HERNIA REPAIR INGUINAL ADULT;  Surgeon: Robert Bellow, MD;  Location: [facility] ORS;  Service: General;  Laterality: Left;  . TONSILLECTOMY    . UPPER GI ENDOSCOPY  2009   Dr Vira Agar    Family History  Problem Relation Age of Onset  . Adopted: Yes    Social History Social History  Substance Use Topics  . Smoking status: Never Smoker  . Smokeless tobacco: Never Used  . Alcohol use 0.6 oz/week    1 Standard drinks or equivalent per week     Comment: 1 wine daily    No Known Allergies  Current Outpatient Prescriptions  Medication Sig Dispense Refill  . acetaminophen-codeine (TYLENOL #3) 300-30 MG tablet Take 1 tablet by mouth every 4 (four) hours as needed for moderate pain. 30 tablet 0  . atorvastatin (LIPITOR) 10 MG tablet Take 10 mg by mouth every morning.     .  hydrochlorothiazide (MICROZIDE) 12.5 MG capsule Take 12.5 mg by mouth daily.   4  . losartan (COZAAR) 25 MG tablet Take 25 mg by mouth every morning.   4  . omeprazole (PRILOSEC OTC) 20 MG tablet Take 20 mg by mouth daily as needed (for acid reflex/indigestion).    Marland Kitchen oxyCODONE-acetaminophen (ROXICET) 5-325 MG tablet Take 1 tablet by mouth every 4 (four) hours as needed for severe pain. 20 tablet 0  . timolol (TIMOPTIC) 0.5 % ophthalmic solution Place 1 drop into both eyes daily.     No current facility-administered medications for this visit.     Review of Systems Review of Systems  Constitutional: Negative.   Respiratory: Negative.   Cardiovascular: Negative.     Blood pressure 130/78, pulse 69, resp. rate 12, height 6' (1.829 m), weight 161 lb (73 kg).  Physical Exam Physical Exam  Constitutional: He is oriented to person, place, and time. He appears well-developed and well-nourished.  Abdominal: Normal appearance. No hernia.  Left incision site is clean and healing well.   Genitourinary:     Neurological: He is alert and oriented to person, place, and time.  Skin: Skin is warm and dry.    Data Reviewed The mesh plug placed at the time of November 2017 hernia repair  was removed leaving the onlay mesh in place.   Assessment    Resolution of left groin pain status post mesh plug removal.    Plan    Proper lifting technique reviewed. Activity is limited by comfort.    Patient to return as needed.  HPI, Physical Exam, Assessment and Plan have been scribed under the direction and in the presence of Hervey Ard, MD.  Gaspar Cola, CMA  I have completed the exam and reviewed the above documentation for accuracy and completeness.  I agree with the above.  Haematologist has been used and any errors in dictation or transcription are unintentional.  Hervey Ard, M.D., F.A.C.S.  Robert Bellow 04/10/2017, 7:51 AM

## 2017-04-09 ENCOUNTER — Other Ambulatory Visit: Payer: Self-pay | Admitting: Physician Assistant

## 2017-04-09 ENCOUNTER — Ambulatory Visit
Admission: RE | Admit: 2017-04-09 | Discharge: 2017-04-09 | Disposition: A | Discharge status: Home / Self Care | Payer: BLUE CROSS/BLUE SHIELD | Source: Ambulatory Visit | Attending: Physician Assistant | Admitting: Physician Assistant

## 2017-04-09 DIAGNOSIS — G8929 Other chronic pain: Secondary | ICD-10-CM

## 2017-04-09 DIAGNOSIS — M5441 Lumbago with sciatica, right side: Principal | ICD-10-CM

## 2017-04-09 DIAGNOSIS — M5126 Other intervertebral disc displacement, lumbar region: Secondary | ICD-10-CM | POA: Diagnosis not present

## 2017-10-19 DIAGNOSIS — Z23 Encounter for immunization: Secondary | ICD-10-CM | POA: Diagnosis not present

## 2018-01-01 DIAGNOSIS — H40003 Preglaucoma, unspecified, bilateral: Secondary | ICD-10-CM | POA: Diagnosis not present

## 2018-01-12 DIAGNOSIS — R509 Fever, unspecified: Secondary | ICD-10-CM | POA: Diagnosis not present

## 2018-04-22 IMAGING — MR MR LUMBAR SPINE W/O CM
4 of 5 series · 14 of 48 positions shown · non-contrast
Comparison: None available.

CLINICAL DATA: Initial evaluation for chronic right-sided low back
pain.

EXAM:
MRI LUMBAR SPINE WITHOUT CONTRAST
TECHNIQUE: Multiplanar, multisequence MR imaging of the lumbar spine was
performed. No intravenous contrast was administered.

[Series 3: T2 · sagittal · 4.0mm · 0.44mm/px · 5 of 15 slices shown (1 of 2)]
[im 1/15]
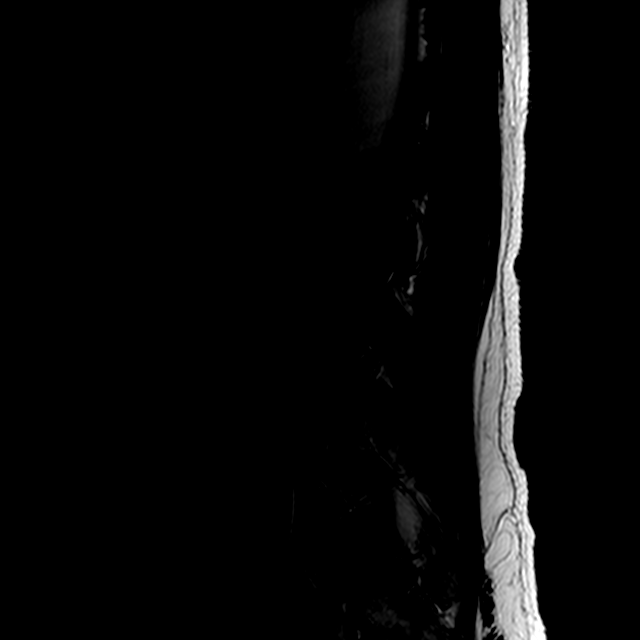
[im 3/15]
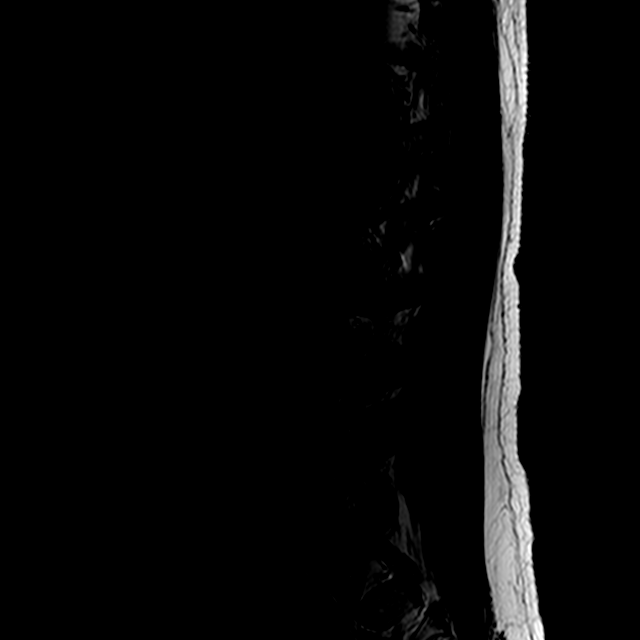
[im 6/15]
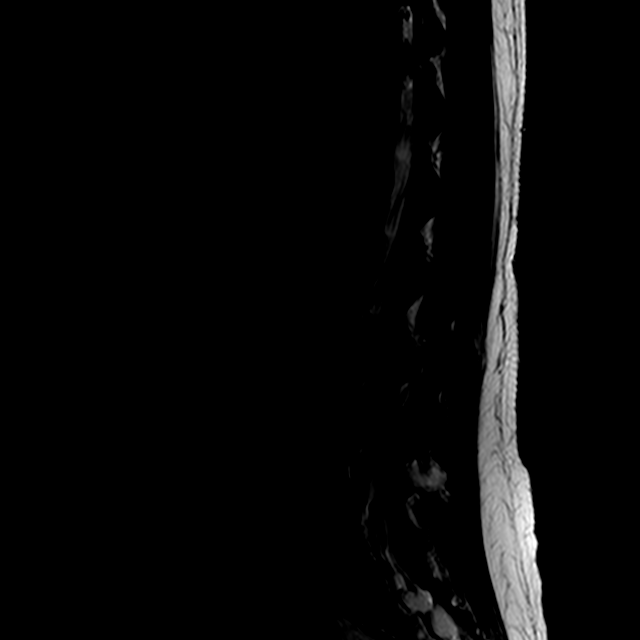
[im 9/15]
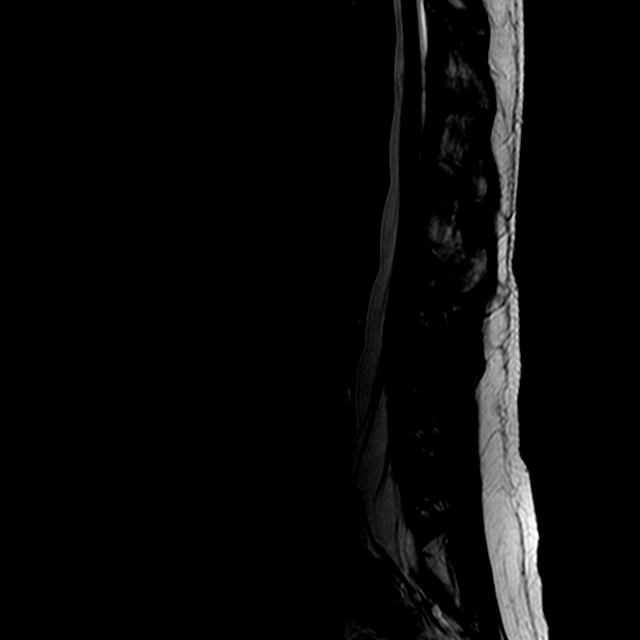
[im 15/15]
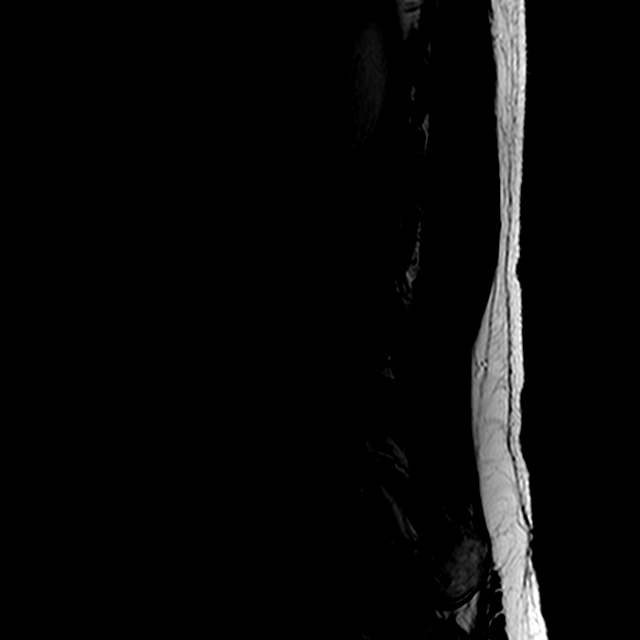

[Series 4: T1 · sagittal · 4.0mm · 0.44mm/px · 3 of 15 slices shown (1 of 2)]
[im 3/15]
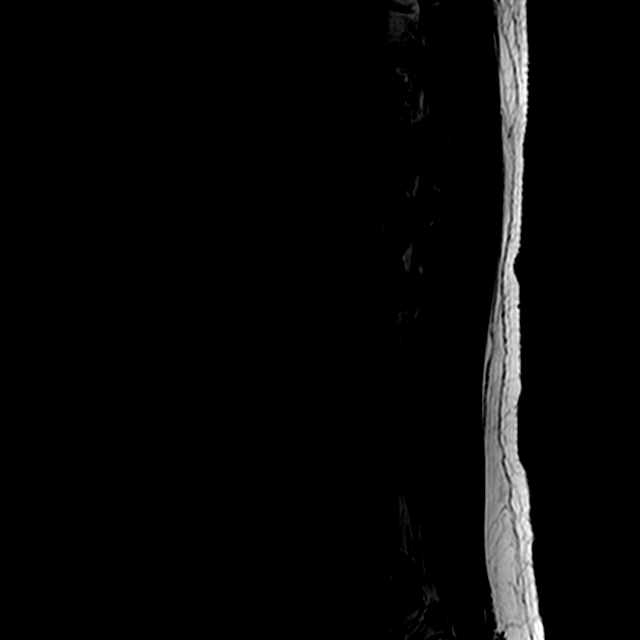
[im 8/15]
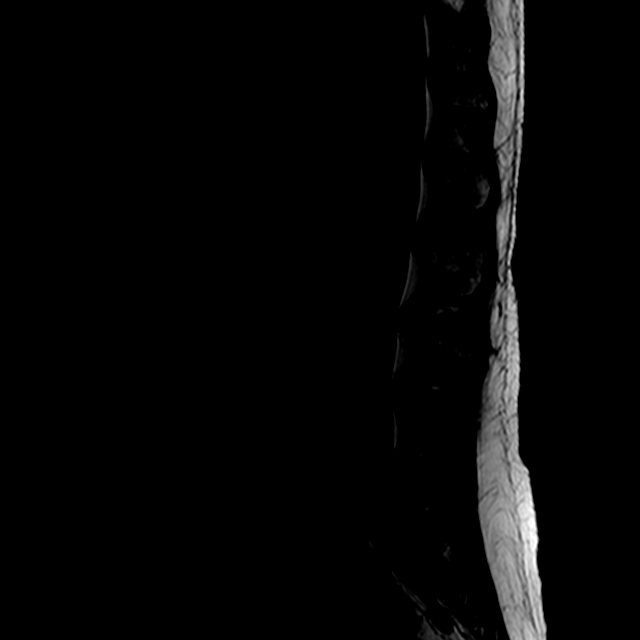
[im 12/15]
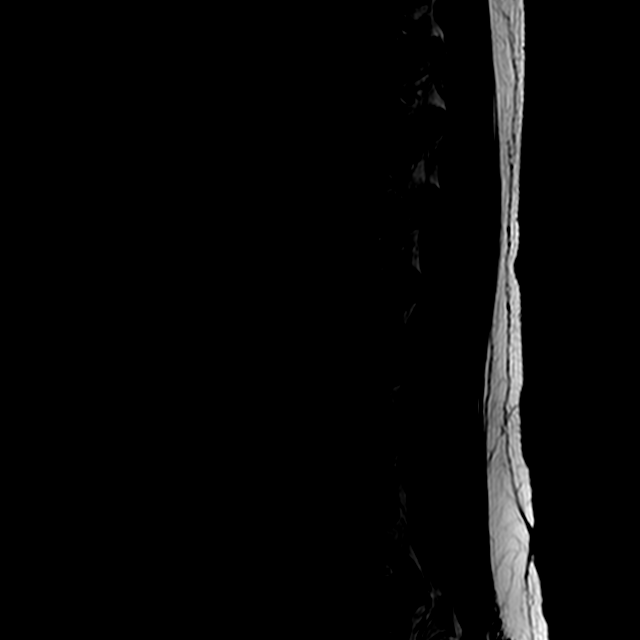

[Series 6: T2 · axial · 4.0mm · 0.39mm/px · z∈[-54,+87]mm · 3 of 32 slices shown (2 of 2)]
[im 5/32]
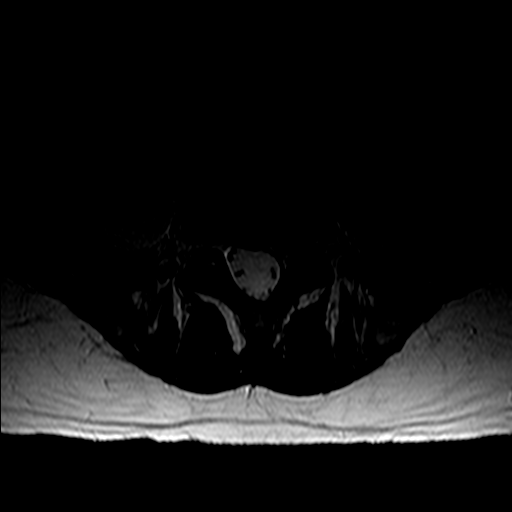
[im 17/32]
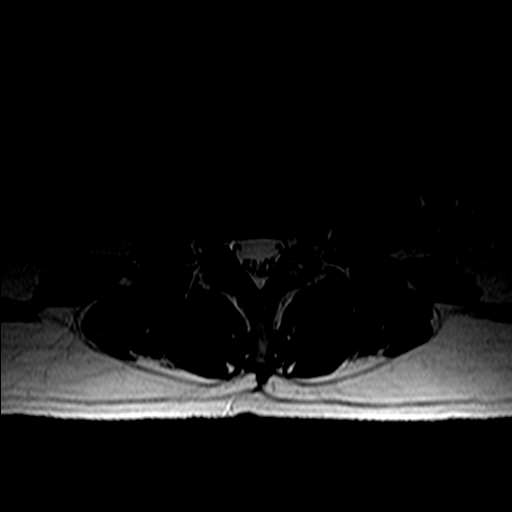
[im 27/32]
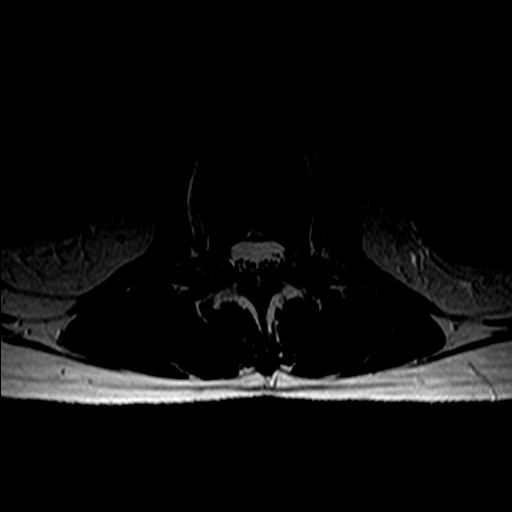

[Series 7: T1 · axial · 4.0mm · 0.39mm/px · z∈[-54,+87]mm · 3 of 32 slices shown (2 of 2)]
[im 5/32]
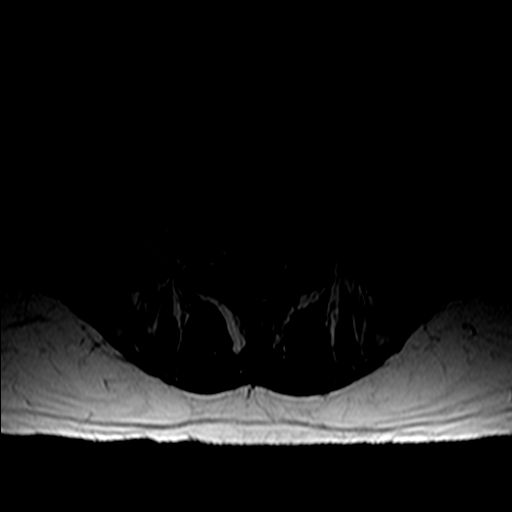
[im 17/32]
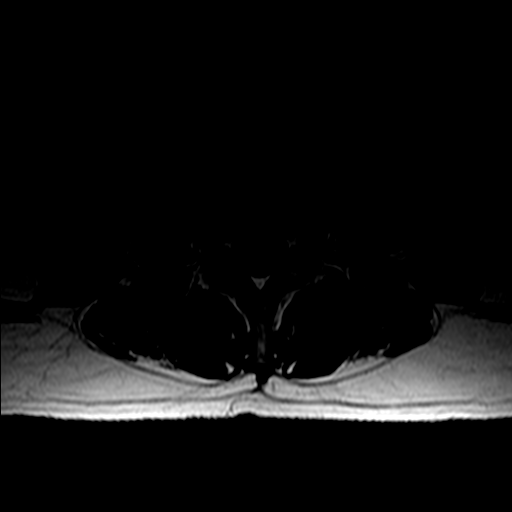
[im 27/32]
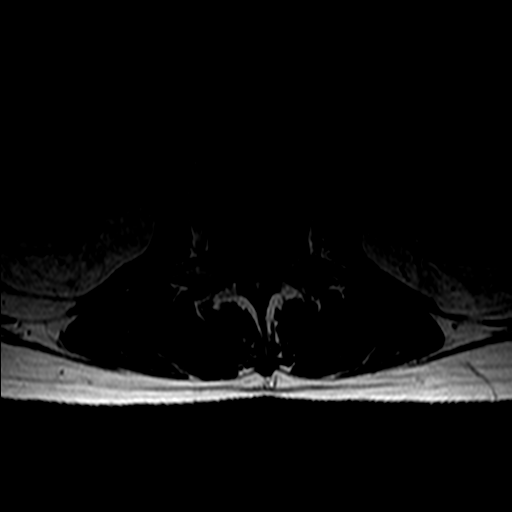

[14 of 48 positions shown; findings below may reference images not displayed]

FINDINGS: Segmentation: Normal segmentation. Lowest well-formed disc is
labeled the L5-S1 level.

Alignment: Vertebral bodies normally aligned with preservation of
the normal lumbar lordosis. No listhesis.

Vertebrae: Vertebral body heights well preserved. No evidence for
acute or chronic fracture. Signal intensity within the vertebral
body bone marrow is normal. No discrete or worrisome osseous
lesions. No abnormal marrow edema.

Conus medullaris: Extends to the L1 level and appears normal.

Paraspinal and other soft tissues: Paraspinous soft tissues within
normal limits. Visualized visceral structures unremarkable.

Disc levels:

L1-2: Disc desiccation with mild intervertebral disc space narrowing
and minimal diffuse disc bulge. No significant canal or foraminal
stenosis.

L2-3: Mild diffuse disc desiccation with minimal disc bulge.
Superimposed right foraminal disc protrusion with associated annular
fissure (series 6, image 10). Treating disc contacts the exiting
right L2 nerve root in the right lateral recess (series 3, image 4).
Resultant mild to moderate right L2 foraminal narrowing. No
significant canal or left foraminal stenosis.

L3-4: Mild circumferential disc bulge without focal disc protrusion.
No significant canal or foraminal stenosis.

L4-5: Minimal circumferential disc bulge with disc desiccation. No
canal stenosis. Mild bilateral L4 foraminal narrowing.

L5-S1: Normal interspace. No significant facet disease. No canal or
foraminal stenosis.
IMPRESSION: 1. Shallow right foraminal disc protrusion at L2-3, as the exiting
right L2 nerve root. Finding could potentially result in lower
extremity radicular symptoms.
2. Mild degenerative disc bulging at L1-2, L3-4, and L4-5 as above
without significant stenosis or evidence for neural impingement.

## 2018-07-08 DIAGNOSIS — H40053 Ocular hypertension, bilateral: Secondary | ICD-10-CM | POA: Diagnosis not present

## 2018-09-08 DIAGNOSIS — E7800 Pure hypercholesterolemia, unspecified: Secondary | ICD-10-CM | POA: Diagnosis not present

## 2018-09-08 DIAGNOSIS — Z125 Encounter for screening for malignant neoplasm of prostate: Secondary | ICD-10-CM | POA: Diagnosis not present

## 2018-09-08 DIAGNOSIS — Z1159 Encounter for screening for other viral diseases: Secondary | ICD-10-CM | POA: Diagnosis not present

## 2018-09-08 DIAGNOSIS — Z79899 Other long term (current) drug therapy: Secondary | ICD-10-CM | POA: Diagnosis not present

## 2018-09-08 DIAGNOSIS — I10 Essential (primary) hypertension: Secondary | ICD-10-CM | POA: Diagnosis not present

## 2018-09-08 DIAGNOSIS — D472 Monoclonal gammopathy: Secondary | ICD-10-CM | POA: Diagnosis not present

## 2018-10-18 DIAGNOSIS — M79645 Pain in left finger(s): Secondary | ICD-10-CM | POA: Diagnosis not present

## 2019-01-07 ENCOUNTER — Encounter: Payer: Self-pay | Admitting: *Deleted

## 2019-01-10 ENCOUNTER — Ambulatory Visit
Admission: RE | Admit: 2019-01-10 | Discharge: 2019-01-10 | Disposition: A | Discharge status: Home / Self Care | Payer: 59 | Attending: Unknown Physician Specialty | Admitting: Unknown Physician Specialty

## 2019-01-10 ENCOUNTER — Encounter: Payer: Self-pay | Admitting: *Deleted

## 2019-01-10 ENCOUNTER — Ambulatory Visit: Payer: 59 | Admitting: Certified Registered Nurse Anesthetist

## 2019-01-10 ENCOUNTER — Other Ambulatory Visit: Payer: Self-pay

## 2019-01-10 ENCOUNTER — Encounter
Admission: RE | Disposition: A | Discharge status: Home / Self Care | Payer: Self-pay | Source: Home / Self Care | Attending: Unknown Physician Specialty

## 2019-01-10 DIAGNOSIS — E785 Hyperlipidemia, unspecified: Secondary | ICD-10-CM | POA: Diagnosis not present

## 2019-01-10 DIAGNOSIS — Z79899 Other long term (current) drug therapy: Secondary | ICD-10-CM | POA: Insufficient documentation

## 2019-01-10 DIAGNOSIS — I10 Essential (primary) hypertension: Secondary | ICD-10-CM | POA: Diagnosis not present

## 2019-01-10 DIAGNOSIS — K635 Polyp of colon: Secondary | ICD-10-CM | POA: Diagnosis not present

## 2019-01-10 DIAGNOSIS — K219 Gastro-esophageal reflux disease without esophagitis: Secondary | ICD-10-CM | POA: Diagnosis not present

## 2019-01-10 DIAGNOSIS — Z1211 Encounter for screening for malignant neoplasm of colon: Secondary | ICD-10-CM | POA: Diagnosis not present

## 2019-01-10 DIAGNOSIS — D123 Benign neoplasm of transverse colon: Secondary | ICD-10-CM | POA: Diagnosis not present

## 2019-01-10 DIAGNOSIS — Z7982 Long term (current) use of aspirin: Secondary | ICD-10-CM | POA: Insufficient documentation

## 2019-01-10 DIAGNOSIS — K640 First degree hemorrhoids: Secondary | ICD-10-CM | POA: Diagnosis not present

## 2019-01-10 DIAGNOSIS — H409 Unspecified glaucoma: Secondary | ICD-10-CM | POA: Insufficient documentation

## 2019-01-10 HISTORY — PX: COLONOSCOPY WITH PROPOFOL: SHX5780

## 2019-01-10 SURGERY — COLONOSCOPY WITH PROPOFOL
Anesthesia: General

## 2019-01-10 MED ORDER — PROPOFOL 500 MG/50ML IV EMUL
INTRAVENOUS | Status: AC
  Filled 2019-01-10: qty 50

## 2019-01-10 MED ORDER — LIDOCAINE HCL (CARDIAC) PF 100 MG/5ML IV SOSY
PREFILLED_SYRINGE | INTRAVENOUS | Status: DC | PRN
  Administered 2019-01-10: 50 mg via INTRAVENOUS

## 2019-01-10 MED ORDER — SODIUM CHLORIDE 0.9 % IV SOLN
INTRAVENOUS | Status: DC
  Administered 2019-01-10: 07:00:00 via INTRAVENOUS

## 2019-01-10 MED ORDER — SODIUM CHLORIDE 0.9 % IV SOLN: INTRAVENOUS | Status: DC

## 2019-01-10 MED ORDER — PROPOFOL 500 MG/50ML IV EMUL
INTRAVENOUS | Status: DC | PRN
  Administered 2019-01-10: 130 ug/kg/min via INTRAVENOUS

## 2019-01-10 MED ORDER — PROPOFOL 10 MG/ML IV BOLUS
INTRAVENOUS | Status: DC | PRN
  Administered 2019-01-10: 80 mg via INTRAVENOUS

## 2019-01-10 MED ORDER — LIDOCAINE HCL (PF) 2 % IJ SOLN
INTRAMUSCULAR | Status: AC
  Filled 2019-01-10: qty 10

## 2019-01-10 NOTE — Transfer of Care (Signed)
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  Last Pain:  Vitals:   01/10/19 0803  TempSrc: (P) Tympanic  PainSc:          Complications: No apparent anesthesia complications

## 2019-01-10 NOTE — Anesthesia Postprocedure Evaluation (Signed)
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, MD  Procedure(s) Performed: COLONOSCOPY WITH PROPOFOL (N/A )  Patient location during evaluation: PACU Anesthesia Type: General Level of consciousness: awake and alert Pain management: pain level controlled Vital Signs Assessment: post-procedure vital signs reviewed and stable Respiratory status: spontaneous breathing, nonlabored ventilation and respiratory function stable Cardiovascular status: blood pressure returned to baseline and stable Postop Assessment: no apparent nausea or vomiting Anesthetic complications: no     Last Vitals:  Vitals:   01/10/19 0711 01/10/19 0803  BP: (!) 142/95 103/70  Pulse: 68 (!) 53  Resp: 16 14  Temp: (!) 35.9 C (!) 36.1 C  SpO2: 100% 100%    Last Pain:  Vitals:   01/10/19 0811  TempSrc: Tympanic  PainSc: 0-No pain                 Durenda Hurt

## 2019-01-10 NOTE — Anesthesia Preprocedure Evaluation (Addendum)
Anesthesia Evaluation  Patient identified by MRN, date of birth, ID band Patient awake    Reviewed: Allergy & Precautions, H&P , NPO status , Patient's Chart, lab work & pertinent test results  Airway Mallampati: III  TM Distance: <3 FB   Mouth opening: Limited Mouth Opening Comment: Small chin, TM ~2 FB Small mouth opening Dental   Pulmonary neg pulmonary ROS, neg COPD,           Cardiovascular hypertension, (-) angina(-) Past MI, (-) Cardiac Stents and (-) CABG (-) dysrhythmias      Neuro/Psych negative neurological ROS  negative psych ROS   GI/Hepatic Neg liver ROS, GERD  Controlled,  Endo/Other  negative endocrine ROS  Renal/GU negative Renal ROS  negative genitourinary   Musculoskeletal   Abdominal   Peds  Hematology negative hematology ROS (+)   Anesthesia Other Findings Past Medical History: No date: Adopted No date: Central retinal vein occlusion No date: GERD (gastroesophageal reflux disease) No date: Glaucoma No date: HTN (hypertension) No date: Hyperlipidemia 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) No date: Pilonidal cyst 09/09/2016: URI (upper respiratory infection)     Comment:  STARTED ON LEVAQUIN BY PCP  Past Surgical History: 2009: COLONOSCOPY     Comment:  Dr Vira Agar 04/01/2017: EXCISION OF MESH; Left     Comment:  Procedure: REMOVAL MESH PLUG AND NEURORRHAPHY;  Surgeon:              Robert Bellow, MD;  Location: [facility] ORS;  Service:               General;  Laterality: Left; 1996: INGUINAL HERNIA REPAIR; Right 09/15/2016: INGUINAL HERNIA REPAIR; Left     Comment:  Procedure: HERNIA REPAIR INGUINAL ADULT;  Surgeon:               Robert Bellow, MD;  Location: [facility] ORS;  Service:               General;  Laterality: Left; No date: PILONIDAL CYST / SINUS EXCISION     Comment:  x2 No date: TONSILLECTOMY 2009: UPPER GI ENDOSCOPY     Comment:  Dr Vira Agar  BMI    Body Mass Index:  20.34 kg/m      Reproductive/Obstetrics negative OB ROS                            Anesthesia Physical Anesthesia Plan  ASA: II  Anesthesia Plan: General   Post-op Pain Management:    Induction:   PONV Risk Score and Plan: Propofol infusion and TIVA  Airway Management Planned: Nasal Cannula  Additional Equipment:   Intra-op Plan:   Post-operative Plan:   Informed Consent: I have reviewed the patients History and Physical, chart, labs and discussed the procedure including the risks, benefits and alternatives for the proposed anesthesia with the patient or authorized representative who has indicated his/her understanding and acceptance.     Dental Advisory Given  Plan Discussed with: Anesthesiologist and CRNA  Anesthesia Plan Comments:         Anesthesia Quick Evaluation

## 2019-01-10 NOTE — H&P (Signed)
Primary Care Physician:  Idelle Crouch, MD Primary Gastroenterologist:  Dr. 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  This is for colon cancer screening.  Last coon was 09/10/09.   Past Medical History:  Diagnosis Date  . Adopted   . Central retinal vein occlusion   . GERD (gastroesophageal reflux disease)   . Glaucoma   . HTN (hypertension)   . Hyperlipidemia   . MGUS (monoclonal gammopathy of unknown significance)   . Pilonidal cyst   . URI (upper respiratory infection) 09/09/2016   STARTED ON LEVAQUIN BY PCP    Past Surgical History:  Procedure Laterality Date  . COLONOSCOPY  2009   Dr Vira Agar  . EXCISION OF MESH Left 04/01/2017   Procedure: REMOVAL MESH PLUG AND NEURORRHAPHY;  Surgeon: Orlan Aversa Bellow, MD;  Location: [facility] ORS;  Service: General;  Laterality: Left;  . INGUINAL HERNIA REPAIR Right 1996  . INGUINAL HERNIA REPAIR Left 09/15/2016   Procedure: HERNIA REPAIR INGUINAL ADULT;  Surgeon: Zeniah Briney Bellow, MD;  Location: [facility] ORS;  Service: General;  Laterality: Left;  . PILONIDAL CYST / SINUS EXCISION     x2  . TONSILLECTOMY    . UPPER GI ENDOSCOPY  2009   Dr Vira Agar    Prior to Admission medications   Medication Sig Start Date End Date Taking? Authorizing Provider  acetaminophen-codeine (TYLENOL #3) 300-30 MG tablet Take 1 tablet by mouth every 4 (four) hours as needed for moderate pain. 03/12/17  Yes Bobbette Eakes Bellow, MD  aspirin EC 81 MG tablet Take 81 mg by mouth daily.   Yes [provider]  atorvastatin (LIPITOR) 10 MG tablet Take 10 mg by mouth every morning.    Yes [provider]  hydrochlorothiazide (MICROZIDE) 12.5 MG capsule Take 12.5 mg by mouth daily.  08/27/16  Yes [provider]  losartan (COZAAR) 25 MG tablet Take 25 mg by mouth every morning.  08/27/16  Yes [provider]  omeprazole (PRILOSEC OTC) 20 MG tablet Take 20 mg by  mouth daily as needed (for acid reflex/indigestion).   Yes [provider]  timolol (TIMOPTIC) 0.5 % ophthalmic solution Place 1 drop into both eyes daily.   Yes [provider]  zolpidem (AMBIEN) 10 MG tablet Take 5 mg by mouth at bedtime as needed for sleep.   Yes [provider]  oxyCODONE-acetaminophen (ROXICET) 5-325 MG tablet Take 1 tablet by mouth every 4 (four) hours as needed for severe pain. 04/01/17   Kabe Mckoy Bellow, MD    Allergies as of 11/19/2018  . (No Known Allergies)    Family History  Adopted: Yes    Social History   Socioeconomic History  . Marital status: Married    Spouse name: Not on file  . Number of children: Not on file  . Years of education: Not on file  . Highest education level: Not on file  Occupational History  . Not on file  Social Needs  . Financial resource strain: Not on file  . Food insecurity:    Worry: Not on file    Inability: Not on file  . Transportation needs:    Medical: Not on file    Non-medical: Not on file  Tobacco Use  . Smoking status: Never Smoker  . Smokeless tobacco: Never Used  Substance and Sexual Activity  . Alcohol use: Yes    Alcohol/week: 7.0 standard drinks  Types: 7 Glasses of wine per week    Comment: 1 wine daily  . Drug use: No  . Sexual activity: Never  Lifestyle  . Physical activity:    Days per week: Not on file    Minutes per session: Not on file  . Stress: Not on file  Relationships  . Social connections:    Talks on phone: Not on file    Gets together: Not on file    Attends religious service: Not on file    Active member of club or organization: Not on file    Attends meetings of clubs or organizations: Not on file    Relationship status: Not on file  . Intimate partner violence:    Fear of current or ex partner: Not on file    Emotionally abused: Not on file    Physically abused: Not on file    Forced sexual activity: Not on file  Other Topics Concern  .  Not on file  Social History Narrative  . Not on file    Review of Systems: See HPI, otherwise negative ROS  Physical Exam: BP (!) 142/95   Pulse 68   Temp (!) 96.7 F (35.9 C) (Tympanic)   Resp 16   Ht 6' (1.829 m)   Wt 68 kg   SpO2 100%   BMI 20.34 kg/m  General:   Alert,  pleasant and cooperative in NAD Head:  Normocephalic and atraumatic. Neck:  Supple; no masses or thyromegaly. Lungs:  Clear throughout to auscultation.    Heart:  Regular rate and rhythm. Abdomen:  Soft, nontender and nondistended. Normal bowel sounds, without guarding, and without rebound.   Neurologic:  Alert and  oriented x4;  grossly normal neurologically.  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  Risks, benefits, limitations, and alternatives regarding  colonoscopy have been reviewed with the patient.  Questions have been answered.  All parties agreeable.   Gaylyn Cheers, MD  01/10/2019, 7:29 AM

## 2019-01-10 NOTE — Anesthesia Post-op Follow-up Note (Signed)
Anesthesia QCDR form completed.        

## 2019-01-10 NOTE — Op Note (Signed)
[name] Patient Name: Jerry Rosales Procedure Date: 01/10/2019 7:26 AM MRN: [id number] Account #: [id number] Date of Birth: [birth year] Admit Type: Outpatient Age: 63 Room: [facility] ENDO ROOM 1 Gender: Male Note Status: Finalized Procedure:            Colonoscopy Indications:          Screening for colorectal malignant neoplasm Providers:            Manya Silvas, MD Medicines:            Propofol per Anesthesia Complications:        No immediate complications. Procedure:            Pre-Anesthesia Assessment:                       - After reviewing the risks and benefits, the patient                        was deemed in satisfactory condition to undergo the                        procedure.                       After obtaining informed consent, the colonoscope was                        passed under direct vision. Throughout the procedure,                        the patient's blood pressure, pulse, and oxygen                        saturations were monitored continuously. The was                        introduced through the anus and advanced to the the                        cecum, identified by appendiceal orifice and ileocecal                        valve. The colonoscopy was performed without                        difficulty. The patient tolerated the procedure well. Findings:      A diminutive polyp was found in the hepatic flexure. The polyp was       sessile. The polyp was removed with a jumbo cold forceps. Resection and       retrieval were complete.      Internal hemorrhoids were found during endoscopy. The hemorrhoids were       small, medium-sized and Grade I (internal hemorrhoids that do not       prolapse).      The exam was otherwise without abnormality. Impression:           - One diminutive polyp at the hepatic flexure, removed                        with a jumbo cold forceps. Resected and retrieved.  - Internal hemorrhoids.                       -  The examination was otherwise normal. Recommendation:       - Await pathology results. Manya Silvas, MD 01/10/2019 8:03:19 AM This report has been signed electronically. Number of Addenda: 0 Note Initiated On: 01/10/2019 7:26 AM Scope Withdrawal Time: 0 hours 11 minutes 20 seconds  Total Procedure Duration: 0 hours 16 minutes 24 seconds       [facility]

## 2019-01-11 LAB — SURGICAL PATHOLOGY

## 2019-05-19 DIAGNOSIS — H40053 Ocular hypertension, bilateral: Secondary | ICD-10-CM | POA: Diagnosis not present

## 2019-06-27 DIAGNOSIS — R0602 Shortness of breath: Secondary | ICD-10-CM | POA: Diagnosis not present

## 2019-06-27 DIAGNOSIS — Z743 Need for continuous supervision: Secondary | ICD-10-CM | POA: Diagnosis not present

## 2019-11-17 DIAGNOSIS — H40003 Preglaucoma, unspecified, bilateral: Secondary | ICD-10-CM | POA: Diagnosis not present

## 2020-01-18 ENCOUNTER — Other Ambulatory Visit: Payer: Self-pay

## 2020-01-18 ENCOUNTER — Emergency Department: Payer: 59

## 2020-01-18 ENCOUNTER — Emergency Department
Admission: EM | Admit: 2020-01-18 | Discharge: 2020-01-18 | Disposition: A | Discharge status: Home / Self Care | Payer: 59 | Attending: Emergency Medicine | Admitting: Emergency Medicine

## 2020-01-18 ENCOUNTER — Encounter: Payer: Self-pay | Admitting: Emergency Medicine

## 2020-01-18 DIAGNOSIS — R0689 Other abnormalities of breathing: Secondary | ICD-10-CM | POA: Diagnosis not present

## 2020-01-18 DIAGNOSIS — R110 Nausea: Secondary | ICD-10-CM | POA: Diagnosis not present

## 2020-01-18 DIAGNOSIS — R410 Disorientation, unspecified: Secondary | ICD-10-CM

## 2020-01-18 DIAGNOSIS — Z7982 Long term (current) use of aspirin: Secondary | ICD-10-CM | POA: Insufficient documentation

## 2020-01-18 DIAGNOSIS — I10 Essential (primary) hypertension: Secondary | ICD-10-CM | POA: Insufficient documentation

## 2020-01-18 DIAGNOSIS — R112 Nausea with vomiting, unspecified: Secondary | ICD-10-CM

## 2020-01-18 DIAGNOSIS — Z79899 Other long term (current) drug therapy: Secondary | ICD-10-CM | POA: Insufficient documentation

## 2020-01-18 DIAGNOSIS — R197 Diarrhea, unspecified: Secondary | ICD-10-CM | POA: Diagnosis not present

## 2020-01-18 DIAGNOSIS — R531 Weakness: Secondary | ICD-10-CM | POA: Diagnosis not present

## 2020-01-18 DIAGNOSIS — R1111 Vomiting without nausea: Secondary | ICD-10-CM | POA: Diagnosis not present

## 2020-01-18 LAB — CBC WITH DIFFERENTIAL/PLATELET
Abs Immature Granulocytes: 0.01 10*3/uL (ref 0.00–0.07)
Basophils Absolute: 0 10*3/uL (ref 0.0–0.1)
Basophils Relative: 1 %
Eosinophils Absolute: 0.6 10*3/uL — ABNORMAL HIGH (ref 0.0–0.5)
Eosinophils Relative: 11 %
HCT: 40 % (ref 39.0–52.0)
Hemoglobin: 13.5 g/dL (ref 13.0–17.0)
Immature Granulocytes: 0 %
Lymphocytes Relative: 40 %
Lymphs Abs: 2.2 10*3/uL (ref 0.7–4.0)
MCH: 31.5 pg (ref 26.0–34.0)
MCHC: 33.8 g/dL (ref 30.0–36.0)
MCV: 93.2 fL (ref 80.0–100.0)
Monocytes Absolute: 0.5 10*3/uL (ref 0.1–1.0)
Monocytes Relative: 9 %
Neutro Abs: 2.1 10*3/uL (ref 1.7–7.7)
Neutrophils Relative %: 39 %
Platelets: 214 10*3/uL (ref 150–400)
RBC: 4.29 MIL/uL (ref 4.22–5.81)
RDW: 12.1 % (ref 11.5–15.5)
WBC: 5.4 10*3/uL (ref 4.0–10.5)
nRBC: 0 % (ref 0.0–0.2)

## 2020-01-18 LAB — COMPREHENSIVE METABOLIC PANEL
ALT: 19 U/L (ref 0–44)
AST: 21 U/L (ref 15–41)
Albumin: 4 g/dL (ref 3.5–5.0)
Alkaline Phosphatase: 55 U/L (ref 38–126)
Anion gap: 10 (ref 5–15)
BUN: 16 mg/dL (ref 8–23)
CO2: 27 mmol/L (ref 22–32)
Calcium: 8.6 mg/dL — ABNORMAL LOW (ref 8.9–10.3)
Chloride: 108 mmol/L (ref 98–111)
Creatinine, Ser: 0.78 mg/dL (ref 0.61–1.24)
GFR calc Af Amer: >60 mL/min (ref >60)
GFR calc non Af Amer: >60 mL/min (ref >60)
Glucose, Bld: 103 mg/dL — ABNORMAL HIGH (ref 70–99)
Potassium: 3 mmol/L — ABNORMAL LOW (ref 3.5–5.1)
Sodium: 145 mmol/L (ref 135–145)
Total Bilirubin: 0.6 mg/dL (ref 0.3–1.2)
Total Protein: 6.6 g/dL (ref 6.5–8.1)

## 2020-01-18 LAB — URINALYSIS, COMPLETE (UACMP) WITH MICROSCOPIC
Bacteria, UA: NONE SEEN
Bilirubin Urine: NEGATIVE
Glucose, UA: NEGATIVE mg/dL
Hgb urine dipstick: NEGATIVE
Ketones, ur: NEGATIVE mg/dL
Leukocytes,Ua: NEGATIVE
Nitrite: NEGATIVE
Protein, ur: NEGATIVE mg/dL
Specific Gravity, Urine: 1.013 (ref 1.005–1.030)
Squamous Epithelial / HPF: NONE SEEN (ref 0–5)
pH: 5 (ref 5.0–8.0)

## 2020-01-18 LAB — LIPASE, BLOOD: Lipase: 66 U/L — ABNORMAL HIGH (ref 11–51)

## 2020-01-18 LAB — TROPONIN I (HIGH SENSITIVITY): Troponin I (High Sensitivity): <2 ng/L (ref <18)

## 2020-01-18 MED ORDER — PROMETHAZINE HCL 25 MG/ML IJ SOLN
12.5000 mg | Freq: Once | INTRAMUSCULAR | Status: AC
  Administered 2020-01-18: 12.5 mg via INTRAVENOUS
  Filled 2020-01-18: qty 1

## 2020-01-18 MED ORDER — SODIUM CHLORIDE 0.9 % IV BOLUS
1000.0000 mL | Freq: Once | INTRAVENOUS | Status: AC
  Administered 2020-01-18: 1000 mL via INTRAVENOUS

## 2020-01-18 MED ORDER — ONDANSETRON 4 MG PO TBDP: 4.0000 mg | ORAL_TABLET | Freq: Three times a day (TID) | ORAL | 0 refills | Status: DC | PRN

## 2020-01-18 NOTE — ED Provider Notes (Signed)
[facility] Emergency Department Provider Note   ____________________________________________   First MD Initiated Contact with Patient 01/18/20 [id number]     (approximate)  I have reviewed the triage vital signs and the nursing notes.   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  Family was concerned and noted to EMS that patient was not acting right.  Family told EMS patient was having vomiting in addition to nausea and diarrhea.  Patient denies fever, chest pain, shortness of breath, abdominal pain.  Traveled to [place] 2 weeks ago.  Has had both doses of his COVID-19 vaccine.       Past Medical History:  Diagnosis Date  . Adopted   . Central retinal vein occlusion   . GERD (gastroesophageal reflux disease)   . Glaucoma   . HTN (hypertension)   . Hyperlipidemia   . MGUS (monoclonal gammopathy of unknown significance)   . Pilonidal cyst   . URI (upper respiratory infection) 09/09/2016   STARTED ON LEVAQUIN BY PCP    Patient Active Problem List   Diagnosis Date Noted  . Incisional pain 12/31/2016    Past Surgical History:  Procedure Laterality Date  . COLONOSCOPY  2009   Dr Vira Agar  . COLONOSCOPY WITH PROPOFOL N/A 01/10/2019   Procedure: COLONOSCOPY WITH PROPOFOL;  Surgeon: Manya Silvas, MD;  Location: [facility] ENDOSCOPY;  Service: Endoscopy;  Laterality: N/A;  . EXCISION OF MESH Left 04/01/2017   Procedure: REMOVAL MESH PLUG AND NEURORRHAPHY;  Surgeon: Robert Bellow, MD;  Location: [facility] ORS;  Service: General;  Laterality: Left;  . INGUINAL HERNIA REPAIR Right 1996  . INGUINAL HERNIA REPAIR Left 09/15/2016   Procedure: HERNIA REPAIR INGUINAL ADULT;  Surgeon: Robert Bellow, MD;  Location: [facility] ORS;  Service: General;  Laterality: Left;  .  PILONIDAL CYST / SINUS EXCISION     x2  . TONSILLECTOMY    . UPPER GI ENDOSCOPY  2009   Dr Vira Agar    Prior to Admission medications   Medication Sig Start Date End Date Taking? Authorizing Provider  acetaminophen-codeine (TYLENOL #3) 300-30 MG tablet Take 1 tablet by mouth every 4 (four) hours as needed for moderate pain. 03/12/17   Robert Bellow, MD  aspirin EC 81 MG tablet Take 81 mg by mouth daily.    [provider]  atorvastatin (LIPITOR) 10 MG tablet Take 10 mg by mouth every morning.     [provider]  hydrochlorothiazide (MICROZIDE) 12.5 MG capsule Take 12.5 mg by mouth daily.  08/27/16   [provider]  losartan (COZAAR) 25 MG tablet Take 25 mg by mouth every morning.  08/27/16   [provider]  omeprazole (PRILOSEC OTC) 20 MG tablet Take 20 mg by mouth daily as needed (for acid reflex/indigestion).    [provider]  ondansetron (ZOFRAN ODT) 4 MG disintegrating tablet Take 1 tablet (4 mg total) by mouth every 8 (eight) hours as needed for nausea or vomiting. 01/18/20   Paulette Blanch, MD  oxyCODONE-acetaminophen (ROXICET) 5-325 MG tablet Take 1 tablet by mouth every 4 (four) hours as needed for severe pain. 04/01/17   Robert Bellow, MD  timolol (TIMOPTIC) 0.5 % ophthalmic solution Place 1 drop into both eyes daily.  [provider]  zolpidem (AMBIEN) 10 MG tablet Take 5 mg by mouth at bedtime as needed for sleep.    [provider]    Allergies Patient has no known allergies.  Family History  Adopted: Yes    Social History Social History   Tobacco Use  . Smoking status: Never Smoker  . Smokeless tobacco: Never Used  Substance Use Topics  . Alcohol use: Yes    Alcohol/week: 7.0 standard drinks    Types: 7 Glasses of wine per week    Comment: 1 wine daily  . Drug use: No    Review of Systems  Constitutional: No fever/chills Eyes: No visual changes. ENT: No sore throat. Cardiovascular:  Denies chest pain. Respiratory: Denies shortness of breath. Gastrointestinal: No abdominal pain.  Positive for nausea, vomiting and diarrhea.  No constipation. Genitourinary: Negative for dysuria. Musculoskeletal: Negative for back pain. Skin: Negative for rash. Neurological: Negative for headaches, focal weakness or numbness.   ____________________________________________   PHYSICAL EXAM:  VITAL SIGNS: ED Triage Vitals  Enc Vitals Group     BP      Pulse      Resp      Temp      Temp src      SpO2      Weight      Height      Head Circumference      Peak Flow      Pain Score      Pain Loc      Pain Edu?      Excl. in [place]?     Constitutional: Alert and oriented. Well appearing and in mild acute distress. Eyes: Conjunctivae are normal. PERRL. EOMI. Head: Atraumatic. Nose: No congestion/rhinnorhea. Mouth/Throat: Mucous membranes are mildly dry. Neck: No stridor.  Supple neck without meningismus. Cardiovascular: Normal rate, regular rhythm. Grossly normal heart sounds.  Good peripheral circulation. Respiratory: Normal respiratory effort.  No retractions. Lungs CTAB. Gastrointestinal: Soft and nontender to light or deep palpation. No distention. No abdominal bruits. No CVA tenderness. Musculoskeletal: No lower extremity tenderness nor edema.  No joint effusions. Neurologic: Alert and oriented x3.  CN II-XII grossly intact.  Normal speech and language. No gross focal neurologic deficits are appreciated.  Occasional confusion. Skin:  Skin is warm, dry and intact. No rash noted. Psychiatric: Mood and affect are normal. Speech and behavior are normal.  ____________________________________________   LABS (all labs ordered are listed, but only abnormal results are displayed)  Labs Reviewed  CBC WITH DIFFERENTIAL/PLATELET - Abnormal; Notable for the following components:      Result Value   Eosinophils Absolute 0.6 (*)    All other components within normal limits   COMPREHENSIVE METABOLIC PANEL - Abnormal; Notable for the following components:   Potassium 3.0 (*)    Glucose, Bld 103 (*)    Calcium 8.6 (*)    All other components within normal limits  LIPASE, BLOOD - Abnormal; Notable for the following components:   Lipase 66 (*)    All other components within normal limits  URINALYSIS, COMPLETE (UACMP) WITH MICROSCOPIC  TROPONIN I (HIGH SENSITIVITY)   ____________________________________________  EKG  ED ECG REPORT I, Amahia Madonia J, the attending physician, personally viewed and interpreted this ECG.   Date: 01/18/2020  EKG Time: 0233  Rate: 71  Rhythm: normal EKG, normal sinus rhythm  Axis: Normal  Intervals:none  ST&T Change: Nonspecific  ____________________________________________  RADIOLOGY  ED MD interpretation: No ICH  Official radiology report(s): CT Head  Wo Contrast  Result Date: 01/18/2020 CLINICAL DATA:  Confusion EXAM: CT HEAD WITHOUT CONTRAST TECHNIQUE: Contiguous axial images were obtained from the base of the skull through the vertex without intravenous contrast. COMPARISON:  None. FINDINGS: Brain: No evidence of acute territorial infarction, hemorrhage, hydrocephalus,extra-axial collection or mass lesion/mass effect. Normal gray-white differentiation. Ventricles are normal in size and contour. Vascular: No hyperdense vessel or unexpected calcification. Skull: The skull is intact. No fracture or focal lesion identified. Sinuses/Orbits: Partial opacification of the ethmoid air cells is seen. The orbits and globes intact. Other: None IMPRESSION: No acute intracranial abnormality. Ethmoid air cell sinusitis Electronically Signed   By: Prudencio Pair M.D.   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 He was evaluated in the context of the global COVID-19 pandemic, which necessitated consideration that the patient might be at risk for infection with the SARS-CoV-2 virus that causes COVID-19. Institutional protocols and algorithms that pertain to the evaluation of patients at risk for COVID-19 are in a state of rapid change based on information released by regulatory bodies including the CDC and federal and state organizations. These policies and algorithms were followed during the patient's care in the ED.    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 Differential diagnosis includes, but is not limited to, acute appendicitis, renal colic, testicular torsion, urinary tract infection/pyelonephritis, prostatitis,  epididymitis, diverticulitis, small bowel obstruction or ileus, colitis, abdominal aortic aneurysm, gastroenteritis, hernia, etc.  Will obtain basic lab work, initiate IV fluid resuscitation.  Patient received 8mg  IV Zofran in route to the ED per EMS.  Will obtain CT head for family's concern of altered mentation. Clinical Course as of Jan 17 657  Wed Jan 18, 2020  0430 Patient sleeping in no acute distress.   [JS]  [id number] Patient remains asleep in no acute distress.  Will continue to monitor.   [JS]  [id number] Patient still asleep after IV Phenergan administration. Care transferred at change of shift to Dr. Jimmye Norman.  If patient can tolerate PO challenge, anticipate he may be discharged home with prescription for ODT Zofran.     [JS]    Clinical Course User Index [JS] Paulette Blanch, MD      ____________________________________________   FINAL CLINICAL IMPRESSION(S) / ED DIAGNOSES  Final diagnoses:  Nausea vomiting and diarrhea     ED Discharge Orders         Ordered    ondansetron (ZOFRAN ODT) 4 MG disintegrating tablet  Every 8 hours PRN     01/18/20 [id number]           Note:  This document was prepared using Dragon voice recognition software and may include unintentional dictation errors.   Paulette Blanch, MD 01/18/20 [phone]

## 2020-01-18 NOTE — ED Triage Notes (Signed)
Pt arrived to ED via [facility] from home. Reports nausea and diarrhea multiples times last night. Pt family reports to EMS that pt has just "not been acting right". Pt received 8 mg of Zofran and 500 mL of NS en route. Hx of HTN.

## 2020-01-18 NOTE — Discharge Instructions (Signed)
1.  You may take Zofran as needed for nausea/vomiting. 2.  Clear liquids x12 hours, then bland foods x2 days, then slowly advance diet as tolerated. 3.  Return to the ER for worsening symptoms, persistent vomiting, difficulty breathing or other concerns.

## 2020-01-18 NOTE — ED Notes (Signed)
Given snack and drink for PO challenge.

## 2020-01-18 NOTE — ED Notes (Signed)
This RN spoke with pt wife, Mallie Carosella, and updated her on pt status and plan of care. Pt wife states understanding and denies any further questions at this time.

## 2020-01-18 NOTE — ED Notes (Signed)
Pt able to eat a pack of crackers and half of a sprite without nausea or emesis. Wife at bedside.

## 2020-01-18 NOTE — ED Notes (Signed)
Pt alert and oriented X 4, stable for discharge. RR even and unlabored, color WNL. Discussed discharge instructions and follow up when appropriate. Instructed to follow up with ER for any life threatening symptoms or concerns that patient or family of patient may have. Left with wife.

## 2020-01-18 NOTE — ED Notes (Signed)
Pt is sleeping on stretcher and appears comfortable. Respirations are equal and unlabored, no signs of acute distress.

## 2020-01-18 NOTE — ED Notes (Signed)
ED Provider at bedside. 

## 2020-01-18 NOTE — ED Notes (Signed)
Pt taken to MRI by MRI staff.

## 2020-01-18 NOTE — ED Provider Notes (Signed)
IMPRESSION:  MRI brain:   1. No evidence of acute intracranial abnormality.  2. Minimal chronic small vessel ischemic changes within the cerebral  white matter.  3. Paranasal sinus disease greatest within the bilateral ethmoid and  right maxillary sinuses.  4. Trace bilateral mastoid effusions.   MRA head:   Unremarkable MR angiography of the head. No intracranial large  vessel occlusion or proximal high-grade arterial stenosis.    MRI as dictated above.  He appears to be back to his baseline.  He is cleared for outpatient follow-up.   Earleen Newport, MD 01/18/20 [phone]

## 2020-01-18 NOTE — ED Notes (Signed)
Updated on plan of care for MRI. Wife at bedside, denies further needs.

## 2020-01-18 NOTE — ED Notes (Signed)
Pt transported to CT ?

## 2020-05-29 DIAGNOSIS — Z20822 Contact with and (suspected) exposure to covid-19: Secondary | ICD-10-CM | POA: Diagnosis not present

## 2020-06-14 DIAGNOSIS — H40003 Preglaucoma, unspecified, bilateral: Secondary | ICD-10-CM | POA: Diagnosis not present

## 2020-07-12 DIAGNOSIS — H33312 Horseshoe tear of retina without detachment, left eye: Secondary | ICD-10-CM | POA: Diagnosis not present

## 2020-07-24 DIAGNOSIS — H33312 Horseshoe tear of retina without detachment, left eye: Secondary | ICD-10-CM | POA: Diagnosis not present

## 2020-08-21 DIAGNOSIS — Z23 Encounter for immunization: Secondary | ICD-10-CM | POA: Diagnosis not present

## 2020-09-03 DIAGNOSIS — H33312 Horseshoe tear of retina without detachment, left eye: Secondary | ICD-10-CM | POA: Diagnosis not present

## 2020-09-26 DIAGNOSIS — Z125 Encounter for screening for malignant neoplasm of prostate: Secondary | ICD-10-CM | POA: Diagnosis not present

## 2020-09-26 DIAGNOSIS — D472 Monoclonal gammopathy: Secondary | ICD-10-CM | POA: Diagnosis not present

## 2020-09-26 DIAGNOSIS — E7800 Pure hypercholesterolemia, unspecified: Secondary | ICD-10-CM | POA: Diagnosis not present

## 2020-09-26 DIAGNOSIS — Z79899 Other long term (current) drug therapy: Secondary | ICD-10-CM | POA: Diagnosis not present

## 2020-09-26 DIAGNOSIS — I10 Essential (primary) hypertension: Secondary | ICD-10-CM | POA: Diagnosis not present

## 2020-10-10 DIAGNOSIS — Z20822 Contact with and (suspected) exposure to covid-19: Secondary | ICD-10-CM | POA: Diagnosis not present

## 2020-11-20 DIAGNOSIS — R058 Other specified cough: Secondary | ICD-10-CM | POA: Diagnosis not present

## 2020-11-20 DIAGNOSIS — R0981 Nasal congestion: Secondary | ICD-10-CM | POA: Diagnosis not present

## 2020-11-23 DIAGNOSIS — Z20822 Contact with and (suspected) exposure to covid-19: Secondary | ICD-10-CM | POA: Diagnosis not present

## 2020-11-23 DIAGNOSIS — R6889 Other general symptoms and signs: Secondary | ICD-10-CM | POA: Diagnosis not present

## 2020-12-12 DIAGNOSIS — R5383 Other fatigue: Secondary | ICD-10-CM | POA: Diagnosis not present

## 2020-12-12 DIAGNOSIS — J3489 Other specified disorders of nose and nasal sinuses: Secondary | ICD-10-CM | POA: Diagnosis not present

## 2020-12-12 DIAGNOSIS — R0981 Nasal congestion: Secondary | ICD-10-CM | POA: Diagnosis not present

## 2020-12-28 DIAGNOSIS — H2513 Age-related nuclear cataract, bilateral: Secondary | ICD-10-CM | POA: Diagnosis not present

## 2020-12-28 DIAGNOSIS — H40053 Ocular hypertension, bilateral: Secondary | ICD-10-CM | POA: Diagnosis not present

## 2023-08-12 ENCOUNTER — Other Ambulatory Visit: Payer: Self-pay | Admitting: Physician Assistant

## 2023-08-12 DIAGNOSIS — M67911 Unspecified disorder of synovium and tendon, right shoulder: Secondary | ICD-10-CM

## 2023-08-14 ENCOUNTER — Encounter: Payer: Self-pay | Admitting: Physician Assistant

## 2023-08-25 ENCOUNTER — Other Ambulatory Visit: Payer: 59

## 2023-09-04 ENCOUNTER — Ambulatory Visit
Admission: RE | Admit: 2023-09-04 | Discharge: 2023-09-04 | Disposition: A | Discharge status: Home / Self Care | Payer: BC Managed Care – PPO | Source: Ambulatory Visit | Attending: Physician Assistant | Admitting: Physician Assistant

## 2023-09-04 DIAGNOSIS — M67911 Unspecified disorder of synovium and tendon, right shoulder: Secondary | ICD-10-CM

## 2023-10-20 ENCOUNTER — Other Ambulatory Visit: Payer: Self-pay | Admitting: Surgery

## 2023-11-02 ENCOUNTER — Encounter
Admission: RE | Admit: 2023-11-02 | Discharge: 2023-11-02 | Disposition: A | Discharge status: Home / Self Care | Payer: BC Managed Care – PPO | Source: Ambulatory Visit | Attending: Surgery | Admitting: Surgery

## 2023-11-02 ENCOUNTER — Other Ambulatory Visit: Payer: Self-pay

## 2023-11-02 HISTORY — DX: Anxiety disorder, unspecified: F41.9

## 2023-11-02 HISTORY — DX: Depression, unspecified: F32.A

## 2023-11-02 NOTE — Patient Instructions (Signed)
 Your procedure is scheduled on: Thursday 11/05/23 To find out your arrival time, please call [phone] between 1PM - 3PM on:   Wednesday 11/04/23 Report to the Registration Desk on the 1st floor of the [facility]. FREE Valet parking is available.  If your arrival time is 6:00 am, do not arrive before that time as the [facility] entrance doors do not open until 6:00 am.  REMEMBER: Instructions that are not followed completely may result in serious medical risk, up to and including death; or upon the discretion of your surgeon and anesthesiologist your surgery may need to be rescheduled.  Do not eat food after midnight the night before surgery.  No gum chewing or hard candies.  You may however, drink CLEAR liquids up to 2 hours before you are scheduled to arrive for your surgery. Do not drink anything within 2 hours of your scheduled arrival time.  Clear liquids include: - water  - apple juice without pulp - gatorade (not RED colors) - black coffee or tea (Do NOT add milk or creamers to the coffee or tea) Do NOT drink anything that is not on this list.  Type 1 and Type 2 diabetics should only drink water.  In addition, your doctor has ordered for you to drink the provided:  Ensure Pre-Surgery Clear Carbohydrate Drink  Drinking this carbohydrate drink up to two hours before surgery helps to reduce insulin resistance and improve patient outcomes. Please complete drinking 2 hours before scheduled arrival time.  One week prior to surgery: Stop Anti-inflammatories (NSAIDS) such as Advil, Aleve, Ibuprofen, Motrin, Naproxen, Naprosyn and Aspirin based products such as Excedrin, Goody's Powder, BC Powder. You may however, continue to take Tylenol  if needed for pain up until the day of surgery.  Stop ANY OVER THE COUNTER supplements and vitamins for 7 days until after surgery.  Continue taking all prescribed medications.   TAKE ONLY THESE MEDICATIONS THE MORNING OF SURGERY WITH A SIP OF  WATER:  atorvastatin (LIPITOR) 10 MG tablet  omeprazole (PRILOSEC OTC) 20 MG tablet Antacid (take one the night before and one on the morning of surgery - helps to prevent nausea after surgery.) sertraline (ZOLOFT) 200 MG tablet  oxyCODONE -acetaminophen  (PERCOCET) 10-325 MG tablet if needed You may use your Timolol eye drops.  No Alcohol for 24 hours before or after surgery.  No Smoking including e-cigarettes for 24 hours before surgery.  No chewable tobacco products for at least 6 hours before surgery.  No nicotine patches on the day of surgery.  Do not use any recreational drugs for at least a week (preferably 2 weeks) before your surgery.  Please be advised that the combination of cocaine and anesthesia may have negative outcomes, up to and including death. If you test positive for cocaine, your surgery will be cancelled.  On the morning of surgery brush your teeth with toothpaste and water, you may rinse your mouth with mouthwash if you wish. Do not swallow any toothpaste or mouthwash.  Use CHG Soap or wipes as directed on instruction sheet.  Do not wear lotions, powders, or perfumes. NO DEODORANT  Do not shave body hair from the neck down 48 hours before surgery.  Wear clean comfortable clothing (specific to your surgery type) to the hospital.  Do not wear jewelry, make-up, hairpins, clips or nail polish.  For welded (permanent) jewelry: bracelets, anklets, waist bands, etc.  Please have this removed prior to surgery.  If it is not removed, there is a chance that  hospital personnel will need to cut it off on the day of surgery. Contact lenses, hearing aids and dentures may not be worn into surgery.  Do not bring valuables to the hospital. [facility] is not responsible for any missing/lost belongings or valuables.   Notify your doctor if there is any change in your medical condition (cold, fever, infection).  If you are being discharged the day of surgery, you will not be  allowed to drive home. You will need a responsible individual to drive you home and stay with you for 24 hours after surgery.   If you are taking public transportation, you will need to have a responsible individual with you.  If you are being admitted to the hospital overnight, leave your suitcase in the car. After surgery it may be brought to your room.  In case of increased patient census, it may be necessary for you, the patient, to continue your postoperative care in the Same Day Surgery department.  After surgery, you can help prevent lung complications by doing breathing exercises.  Take deep breaths and cough every 1-2 hours. Your doctor may order a device called an Incentive Spirometer to help you take deep breaths. When coughing or sneezing, hold a pillow firmly against your incision with both hands. This is called "splinting." Doing this helps protect your incision. It also decreases belly discomfort.  Surgery Visitation Policy:  Patients undergoing a surgery or procedure may have two family members or support persons with them as long as the person is not COVID-19 positive or experiencing its symptoms.   Inpatient Visitation:    Visiting hours are 7 a.m. to 8 p.m. Up to four visitors are allowed at one time in a patient room. The visitors may rotate out with other people during the day. One designated support person (adult) may remain overnight.  Please call the Pre-admissions Testing Dept. at [phone] if you have any questions about these instructions.     Preparing for Surgery with CHLORHEXIDINE  GLUCONATE (CHG) Soap  Chlorhexidine  Gluconate (CHG) Soap  o An antiseptic cleaner that kills germs and bonds with the skin to continue killing germs even after washing  o Used for showering the night before surgery and morning of surgery  Before surgery, you can play an important role by reducing the number of germs on your skin.  CHG (Chlorhexidine  gluconate) soap is  an antiseptic cleanser which kills germs and bonds with the skin to continue killing germs even after washing.  Please do not use if you have an allergy to CHG or antibacterial soaps. If your skin becomes reddened/irritated stop using the CHG.  1. Shower the NIGHT BEFORE SURGERY and the MORNING OF SURGERY with CHG soap.  2. If you choose to wash your hair, wash your hair first as usual with your normal shampoo.  3. After shampooing, rinse your hair and body thoroughly to remove the shampoo.  4. Use CHG as you would any other liquid soap. You can apply CHG directly to the skin and wash gently with a scrungie or a clean washcloth.  5. Apply the CHG soap to your body only from the neck down. Do not use on open wounds or open sores. Avoid contact with your eyes, ears, mouth, and genitals (private parts). Wash face and genitals (private parts) with your normal soap.  6. Wash thoroughly, paying special attention to the area where your surgery will be performed.  7. Thoroughly rinse your body with warm water.  8. Do  not shower/wash with your normal soap after using and rinsing off the CHG soap.  9. Pat yourself dry with a clean towel.  10. Wear clean pajamas to bed the night before surgery.  12. Place clean sheets on your bed the night of your first shower and do not sleep with pets.  13. Shower again with the CHG soap on the day of surgery prior to arriving at the hospital.  14. Do not apply any deodorants/lotions/powders.  15. Please wear clean clothes to the hospital.

## 2023-11-05 ENCOUNTER — Ambulatory Visit: Payer: BC Managed Care – PPO

## 2023-11-05 ENCOUNTER — Other Ambulatory Visit: Payer: Self-pay

## 2023-11-05 ENCOUNTER — Encounter
Admission: RE | Disposition: A | Discharge status: Home / Self Care | Payer: Self-pay | Source: Home / Self Care | Attending: Surgery

## 2023-11-05 ENCOUNTER — Ambulatory Visit: Payer: BC Managed Care – PPO | Admitting: General Practice

## 2023-11-05 ENCOUNTER — Encounter: Payer: Self-pay | Admitting: Surgery

## 2023-11-05 ENCOUNTER — Ambulatory Visit: Payer: BC Managed Care – PPO | Admitting: Urgent Care

## 2023-11-05 ENCOUNTER — Ambulatory Visit
Admission: RE | Admit: 2023-11-05 | Discharge: 2023-11-05 | Disposition: A | Discharge status: Home / Self Care | Payer: BC Managed Care – PPO | Attending: Surgery | Admitting: Surgery

## 2023-11-05 DIAGNOSIS — M75111 Incomplete rotator cuff tear or rupture of right shoulder, not specified as traumatic: Secondary | ICD-10-CM | POA: Diagnosis not present

## 2023-11-05 DIAGNOSIS — F32A Depression, unspecified: Secondary | ICD-10-CM | POA: Diagnosis not present

## 2023-11-05 DIAGNOSIS — M7521 Bicipital tendinitis, right shoulder: Secondary | ICD-10-CM | POA: Diagnosis not present

## 2023-11-05 DIAGNOSIS — I10 Essential (primary) hypertension: Secondary | ICD-10-CM | POA: Diagnosis not present

## 2023-11-05 DIAGNOSIS — X58XXXA Exposure to other specified factors, initial encounter: Secondary | ICD-10-CM | POA: Diagnosis not present

## 2023-11-05 DIAGNOSIS — K219 Gastro-esophageal reflux disease without esophagitis: Secondary | ICD-10-CM | POA: Insufficient documentation

## 2023-11-05 DIAGNOSIS — M25811 Other specified joint disorders, right shoulder: Secondary | ICD-10-CM | POA: Insufficient documentation

## 2023-11-05 DIAGNOSIS — F419 Anxiety disorder, unspecified: Secondary | ICD-10-CM | POA: Diagnosis not present

## 2023-11-05 DIAGNOSIS — S43431A Superior glenoid labrum lesion of right shoulder, initial encounter: Secondary | ICD-10-CM | POA: Diagnosis not present

## 2023-11-05 HISTORY — PX: SHOULDER ARTHROSCOPY WITH SUBACROMIAL DECOMPRESSION, ROTATOR CUFF REPAIR AND BICEP TENDON REPAIR: SHX5687

## 2023-11-05 SURGERY — SHOULDER ARTHROSCOPY WITH SUBACROMIAL DECOMPRESSION, ROTATOR CUFF REPAIR AND BICEP TENDON REPAIR
Anesthesia: General | Site: Shoulder | Laterality: Right

## 2023-11-05 MED ORDER — OXYCODONE HCL 5 MG PO TABS: 5.0000 mg | ORAL_TABLET | ORAL | 0 refills | Status: AC | PRN

## 2023-11-05 MED ORDER — MIDAZOLAM HCL 2 MG/2ML IJ SOLN
INTRAMUSCULAR | Status: AC
  Filled 2023-11-05: qty 2

## 2023-11-05 MED ORDER — LACTATED RINGERS IV SOLN
INTRAVENOUS | Status: DC | PRN
  Administered 2023-11-05: 900 mL
  Administered 2023-11-05: 3000 mL

## 2023-11-05 MED ORDER — PHENYLEPHRINE 80 MCG/ML (10ML) SYRINGE FOR IV PUSH (FOR BLOOD PRESSURE SUPPORT)
PREFILLED_SYRINGE | INTRAVENOUS | Status: AC
  Filled 2023-11-05: qty 10

## 2023-11-05 MED ORDER — LACTATED RINGERS IV SOLN: INTRAVENOUS | Status: DC

## 2023-11-05 MED ORDER — KETOROLAC TROMETHAMINE 15 MG/ML IJ SOLN
INTRAMUSCULAR | Status: AC
  Filled 2023-11-05: qty 1

## 2023-11-05 MED ORDER — SODIUM CHLORIDE 0.9 % IV SOLN: INTRAVENOUS | Status: DC

## 2023-11-05 MED ORDER — FENTANYL CITRATE (PF) 100 MCG/2ML IJ SOLN
INTRAMUSCULAR | Status: DC | PRN
  Administered 2023-11-05 (×4): 25 ug via INTRAVENOUS

## 2023-11-05 MED ORDER — EPHEDRINE SULFATE-NACL 50-0.9 MG/10ML-% IV SOSY
PREFILLED_SYRINGE | INTRAVENOUS | Status: DC | PRN
  Administered 2023-11-05 (×2): 10 mg via INTRAVENOUS
  Administered 2023-11-05: 5 mg via INTRAVENOUS

## 2023-11-05 MED ORDER — PROPOFOL 10 MG/ML IV BOLUS
INTRAVENOUS | Status: AC
  Filled 2023-11-05: qty 20

## 2023-11-05 MED ORDER — BUPIVACAINE HCL (PF) 0.5 % IJ SOLN
INTRAMUSCULAR | Status: AC
  Filled 2023-11-05: qty 10

## 2023-11-05 MED ORDER — ROCURONIUM BROMIDE 10 MG/ML (PF) SYRINGE
PREFILLED_SYRINGE | INTRAVENOUS | Status: AC
  Filled 2023-11-05: qty 10

## 2023-11-05 MED ORDER — PHENYLEPHRINE 80 MCG/ML (10ML) SYRINGE FOR IV PUSH (FOR BLOOD PRESSURE SUPPORT)
PREFILLED_SYRINGE | INTRAVENOUS | Status: DC | PRN
  Administered 2023-11-05: 160 ug via INTRAVENOUS

## 2023-11-05 MED ORDER — EPHEDRINE 5 MG/ML INJ
INTRAVENOUS | Status: AC
  Filled 2023-11-05: qty 5

## 2023-11-05 MED ORDER — SUGAMMADEX SODIUM 200 MG/2ML IV SOLN
INTRAVENOUS | Status: DC | PRN
  Administered 2023-11-05: 200 mg via INTRAVENOUS

## 2023-11-05 MED ORDER — OXYCODONE HCL 5 MG PO TABS
5.0000 mg | ORAL_TABLET | ORAL | Status: DC | PRN
Start: 2023-11-05 — End: 2023-11-05

## 2023-11-05 MED ORDER — BUPIVACAINE LIPOSOME 1.3 % IJ SUSP
INTRAMUSCULAR | Status: AC
  Filled 2023-11-05: qty 20

## 2023-11-05 MED ORDER — ONDANSETRON HCL 4 MG/2ML IJ SOLN
4.0000 mg | Freq: Four times a day (QID) | INTRAMUSCULAR | Status: DC | PRN
  Administered 2023-11-05: 4 mg via INTRAVENOUS

## 2023-11-05 MED ORDER — BUPIVACAINE HCL (PF) 0.5 % IJ SOLN
INTRAMUSCULAR | Status: DC | PRN
  Administered 2023-11-05: 10 mL

## 2023-11-05 MED ORDER — FENTANYL CITRATE (PF) 100 MCG/2ML IJ SOLN: 25.0000 ug | INTRAMUSCULAR | Status: DC | PRN

## 2023-11-05 MED ORDER — ONDANSETRON HCL 4 MG/2ML IJ SOLN
INTRAMUSCULAR | Status: AC
  Filled 2023-11-05: qty 2

## 2023-11-05 MED ORDER — ACETAMINOPHEN 325 MG PO TABS: 325.0000 mg | ORAL_TABLET | Freq: Four times a day (QID) | ORAL | Status: DC | PRN

## 2023-11-05 MED ORDER — CHLORHEXIDINE GLUCONATE 0.12 % MT SOLN
OROMUCOSAL | Status: AC
  Filled 2023-11-05: qty 15

## 2023-11-05 MED ORDER — ROCURONIUM BROMIDE 100 MG/10ML IV SOLN
INTRAVENOUS | Status: DC | PRN
  Administered 2023-11-05: 20 mg via INTRAVENOUS
  Administered 2023-11-05: 50 mg via INTRAVENOUS

## 2023-11-05 MED ORDER — PHENYLEPHRINE HCL-NACL 20-0.9 MG/250ML-% IV SOLN
INTRAVENOUS | Status: AC
  Filled 2023-11-05: qty 250

## 2023-11-05 MED ORDER — FENTANYL CITRATE PF 50 MCG/ML IJ SOSY
PREFILLED_SYRINGE | INTRAMUSCULAR | Status: AC
  Filled 2023-11-05: qty 1

## 2023-11-05 MED ORDER — ONDANSETRON HCL 4 MG PO TABS: 4.0000 mg | ORAL_TABLET | Freq: Four times a day (QID) | ORAL | Status: DC | PRN

## 2023-11-05 MED ORDER — BUPIVACAINE LIPOSOME 1.3 % IJ SUSP
INTRAMUSCULAR | Status: DC | PRN
  Administered 2023-11-05: 20 mL

## 2023-11-05 MED ORDER — CEFAZOLIN SODIUM-DEXTROSE 2-4 GM/100ML-% IV SOLN
2.0000 g | INTRAVENOUS | Status: AC
  Administered 2023-11-05: 2 g via INTRAVENOUS

## 2023-11-05 MED ORDER — BUPIVACAINE-EPINEPHRINE (PF) 0.5% -1:200000 IJ SOLN
INTRAMUSCULAR | Status: AC
  Filled 2023-11-05: qty 30

## 2023-11-05 MED ORDER — OXYCODONE HCL 5 MG PO TABS: 5.0000 mg | ORAL_TABLET | Freq: Once | ORAL | Status: DC | PRN

## 2023-11-05 MED ORDER — METOCLOPRAMIDE HCL 5 MG/ML IJ SOLN
5.0000 mg | Freq: Three times a day (TID) | INTRAMUSCULAR | Status: DC | PRN
Start: 2023-11-05 — End: 2023-11-05

## 2023-11-05 MED ORDER — DEXAMETHASONE SODIUM PHOSPHATE 10 MG/ML IJ SOLN
INTRAMUSCULAR | Status: AC
  Filled 2023-11-05: qty 1

## 2023-11-05 MED ORDER — BUPIVACAINE-EPINEPHRINE 0.5% -1:200000 IJ SOLN
INTRAMUSCULAR | Status: DC | PRN
  Administered 2023-11-05: 30 mL

## 2023-11-05 MED ORDER — PROPOFOL 10 MG/ML IV BOLUS
INTRAVENOUS | Status: DC | PRN
  Administered 2023-11-05: 150 mg via INTRAVENOUS
  Administered 2023-11-05: 50 mg via INTRAVENOUS

## 2023-11-05 MED ORDER — ONDANSETRON 4 MG PO TBDP: 4.0000 mg | ORAL_TABLET | Freq: Three times a day (TID) | ORAL | 1 refills | Status: AC | PRN

## 2023-11-05 MED ORDER — ORAL CARE MOUTH RINSE: 15.0000 mL | Freq: Once | OROMUCOSAL | Status: AC

## 2023-11-05 MED ORDER — MIDAZOLAM HCL 2 MG/2ML IJ SOLN
INTRAMUSCULAR | Status: DC | PRN
  Administered 2023-11-05: 2 mg via INTRAVENOUS

## 2023-11-05 MED ORDER — OXYCODONE HCL 5 MG/5ML PO SOLN: 5.0000 mg | Freq: Once | ORAL | Status: DC | PRN

## 2023-11-05 MED ORDER — CHLORHEXIDINE GLUCONATE 0.12 % MT SOLN
15.0000 mL | Freq: Once | OROMUCOSAL | Status: AC
  Administered 2023-11-05: 15 mL via OROMUCOSAL

## 2023-11-05 MED ORDER — LIDOCAINE HCL (CARDIAC) PF 100 MG/5ML IV SOSY
PREFILLED_SYRINGE | INTRAVENOUS | Status: DC | PRN
  Administered 2023-11-05: 30 mg via INTRAVENOUS

## 2023-11-05 MED ORDER — FENTANYL CITRATE (PF) 100 MCG/2ML IJ SOLN
INTRAMUSCULAR | Status: AC
  Filled 2023-11-05: qty 2

## 2023-11-05 MED ORDER — PHENYLEPHRINE HCL-NACL 20-0.9 MG/250ML-% IV SOLN
INTRAVENOUS | Status: DC | PRN
  Administered 2023-11-05: 40 ug/min via INTRAVENOUS

## 2023-11-05 MED ORDER — ONDANSETRON HCL 4 MG/2ML IJ SOLN
INTRAMUSCULAR | Status: DC | PRN
  Administered 2023-11-05: 4 mg via INTRAVENOUS

## 2023-11-05 MED ORDER — FENTANYL CITRATE PF 50 MCG/ML IJ SOSY
50.0000 ug | PREFILLED_SYRINGE | Freq: Once | INTRAMUSCULAR | Status: AC
  Administered 2023-11-05: 50 ug via INTRAVENOUS

## 2023-11-05 MED ORDER — METOCLOPRAMIDE HCL 10 MG PO TABS: 5.0000 mg | ORAL_TABLET | Freq: Three times a day (TID) | ORAL | Status: DC | PRN

## 2023-11-05 MED ORDER — LIDOCAINE HCL (PF) 2 % IJ SOLN
INTRAMUSCULAR | Status: AC
  Filled 2023-11-05: qty 5

## 2023-11-05 MED ORDER — MIDAZOLAM HCL 2 MG/2ML IJ SOLN
1.0000 mg | INTRAMUSCULAR | Status: DC | PRN
Start: 2023-11-05 — End: 2023-11-05
  Administered 2023-11-05: 1 mg via INTRAVENOUS

## 2023-11-05 MED ORDER — KETOROLAC TROMETHAMINE 15 MG/ML IJ SOLN
15.0000 mg | Freq: Once | INTRAMUSCULAR | Status: AC
  Administered 2023-11-05: 15 mg via INTRAVENOUS

## 2023-11-05 MED ORDER — CEFAZOLIN SODIUM-DEXTROSE 2-4 GM/100ML-% IV SOLN
INTRAVENOUS | Status: AC
  Filled 2023-11-05: qty 100

## 2023-11-05 MED ORDER — DEXAMETHASONE SODIUM PHOSPHATE 10 MG/ML IJ SOLN
INTRAMUSCULAR | Status: DC | PRN
  Administered 2023-11-05: 5 mg via INTRAVENOUS

## 2023-11-05 SURGICAL SUPPLY — 47 items
ANCHOR BONE REGENETEN (Anchor) IMPLANT
ANCHOR JUGGERKNOT WTAP NDL 2.9 (Anchor) IMPLANT
ANCHOR SUT W/ ORTHOCORD (Anchor) IMPLANT
ANCHOR TENDON REGENETEN (Staple) IMPLANT
BIT DRILL JUGRKNT W/NDL BIT2.9 (DRILL) IMPLANT
BLADE FULL RADIUS 3.5 (BLADE) ×1 IMPLANT
BUR ACROMIONIZER 4.0 (BURR) ×1 IMPLANT
CHLORAPREP W/TINT 26 (MISCELLANEOUS) ×1 IMPLANT
COVER MAYO STAND STRL (DRAPES) ×1 IMPLANT
DILATOR 5.5 THREADED HEALICOIL (MISCELLANEOUS) IMPLANT
DRILL JUGGERKNOT W/NDL BIT 2.9 (DRILL) ×1
ELECT CAUTERY BLADE 6.4 (BLADE) ×1 IMPLANT
ELECT REM PT RETURN 9FT ADLT (ELECTROSURGICAL) ×1
ELECTRODE REM PT RTRN 9FT ADLT (ELECTROSURGICAL) ×1 IMPLANT
GAUZE SPONGE 4X4 12PLY STRL (GAUZE/BANDAGES/DRESSINGS) ×1 IMPLANT
GAUZE XEROFORM 1X8 LF (GAUZE/BANDAGES/DRESSINGS) ×1 IMPLANT
GLOVE BIO SURGEON STRL SZ7.5 (GLOVE) ×2 IMPLANT
GLOVE BIO SURGEON STRL SZ8 (GLOVE) ×2 IMPLANT
GLOVE BIOGEL PI IND STRL 8 (GLOVE) ×1 IMPLANT
GLOVE INDICATOR 8.0 STRL GRN (GLOVE) ×1 IMPLANT
GOWN STRL REUS W/ TWL LRG LVL3 (GOWN DISPOSABLE) ×1 IMPLANT
GOWN STRL REUS W/ TWL XL LVL3 (GOWN DISPOSABLE) ×1 IMPLANT
GRASPER SUT 15 45D LOW PRO (SUTURE) IMPLANT
IMPL REGENETEN MEDIUM (Shoulder) IMPLANT
IMPLANT REGENETEN MEDIUM (Shoulder) ×1 IMPLANT
IV LR IRRIG 3000ML ARTHROMATIC (IV SOLUTION) ×2 IMPLANT
KIT CANNULA 8X76-LX IN CANNULA (CANNULA) ×1 IMPLANT
MANIFOLD NEPTUNE II (INSTRUMENTS) ×1 IMPLANT
MASK FACE SPIDER DISP (MASK) ×1 IMPLANT
MAT ABSORB FLUID 56X50 GRAY (MISCELLANEOUS) ×1 IMPLANT
PACK ARTHROSCOPY SHOULDER (MISCELLANEOUS) ×1 IMPLANT
PAD ABD DERMACEA PRESS 5X9 (GAUZE/BANDAGES/DRESSINGS) ×2 IMPLANT
PASSER SUT FIRSTPASS SELF (INSTRUMENTS) IMPLANT
SLING ARM LRG DEEP (SOFTGOODS) ×1 IMPLANT
SLING ULTRA II LG (MISCELLANEOUS) ×1 IMPLANT
SPONGE T-LAP 18X18 ~~LOC~~+RFID (SPONGE) ×1 IMPLANT
STAPLER SKIN PROX 35W (STAPLE) ×1 IMPLANT
STRAP SAFETY 5IN WIDE (MISCELLANEOUS) ×1 IMPLANT
SUT ETHIBOND 0 MO6 C/R (SUTURE) ×1 IMPLANT
SUT ULTRABRAID 2 COBRAID 38 (SUTURE) IMPLANT
SUT VIC AB 2-0 CT1 TAPERPNT 27 (SUTURE) ×2 IMPLANT
TAPE MICROFOAM 4IN (TAPE) ×1 IMPLANT
TRAP FLUID SMOKE EVACUATOR (MISCELLANEOUS) ×1 IMPLANT
TUBE SET DOUBLEFLO INFLOW (TUBING) ×1 IMPLANT
TUBING CONNECTING 10 (TUBING) ×1 IMPLANT
WAND WEREWOLF FLOW 90D (MISCELLANEOUS) ×1 IMPLANT
WATER STERILE IRR 500ML POUR (IV SOLUTION) ×1 IMPLANT

## 2023-11-05 NOTE — H&P (Signed)
 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 He is to undergo a right shoulder arthroscopy with debridement, decompression, possible rotator cuff repair and possible bicep tenodesis on 11/05/2023. Since his last visit at the clinic there is been no improvement in his condition. The patient expresses his desire to proceed with surgery.  The patient's symptoms began about 4 months ago and developed without any specific cause or injury. However, the patient recalls [place] amount [place] around the time the symptoms began, and wonders if these activities may have contributed to the onset of his symptoms . The patient describes the symptoms as moderate (patient is active but has had to make modifications or give up activities) and have the quality of being aching, miserable, nagging, stabbing, and tender. The pain is localized to the lateral arm/shoulder and localized to the anterior shoulder. These symptoms are aggravated with normal daily activities, with sleeping, at higher levels of activity, with overhead activity, reaching behind the back, and getting dressed. He has tried acetaminophen , non-steroidal anti-inflammatories (Advil), and narcotics with limited benefit. He has tried physical therapy and rest with no significant benefit. He has tried a steroid injection, also with no substantial relief of symptoms. The patient is right-hand dominant. He denies any neck pain, nor does he note any numbness or paresthesias down his arm to his hand.   Past Medical History: Glaucoma (increased eye pressure)  Hyperlipidemia  Hypertension   Past Surgical History: retinal tear repair Left 07/2020  COLONOSCOPY 01/10/2019, 09/10/2009 (Dr. FABIENE Holmes @ [facility] - Adenomatous Polyp, rpt 5 yrs per RTE)  HERNIA REPAIR  PILONIDAL CYST / SINUS EXCISION x2   Past Family History: Adopted: Yes  Family history unknown: Yes    Medications: adalimumab (HUMIRA,CF, PEN) 40 mg/0.4 mL pen injector kit Inject 40 mg subcutaneously every 14 (fourteen) days 6 kit 3  adalimumab-ryvk 40 mg/0.4 mL atIK Inject 40 mg subcutaneously every 14 (fourteen) days  atorvastatin (LIPITOR) 10 MG tablet TAKE 1 TABLET BY MOUTH EVERY DAY 30 tablet 11  losartan-hydroCHLOROthiazide (HYZAAR) 100-12.5 mg tablet TAKE 1 TABLET BY MOUTH EVERY DAY 30 tablet 11  omeprazole (PRILOSEC OTC) 20 MG tablet Take 1 tablet (20 mg total) by mouth once daily. 30 tablet 11  oxyCODONE -acetaminophen  (PERCOCET) 10-325 mg tablet Take 1 tablet by mouth every 6 (six) hours as needed for Pain 60 tablet 0  sertraline (ZOLOFT) 100 MG tablet Take 200 mg by mouth daily with breakfast  timolol maleate (TIMOPTIC) 0.25 % ophthalmic solution 1 drop 2 (two) times daily.  VRAYLAR 3 mg capsule Take 3 mg by mouth at bedtime   Allergies: No Known Allergies   Review of Systems: A comprehensive 14 point ROS was performed, reviewed, and the pertinent orthopaedic findings are documented in the HPI.  Physical Exam: BP 122/84 (BP Location: Left upper arm, Patient Position: Sitting, BP Cuff Size: Adult)  Ht 177.8 cm (5' 10)  Wt 83.9 kg (185 lb)  BMI 26.54 kg/m   General: Well-developed well-nourished male seen in no acute distress.   HEENT: Atraumatic,normocephalic. Pupils are equal and reactive to light. Oropharynx is clear with moist mucosa  Lungs: Clear to auscultation bilaterally   Cardiovascular: Regular rate and rhythm. Normal S1, S2. No murmurs. No appreciable gallops or rubs. Peripheral pulses are palpable.  Abdomen: Soft, non-tender, nondistended. Bowel sounds present  Extremity: Right shoulder exam: SKIN: normal SWELLING: none WARMTH: none LYMPH NODES:  no adenopathy palpable CREPITUS: none TENDERNESS: Mildly tender over the anterior aspect of her shoulder in the area the bicipital groove ROM (active):  Forward flexion: 145 degrees Abduction: 135  degrees Internal rotation: Right buttock ROM (passive):  Forward flexion: 155 degrees Abduction: 145 degrees  ER/IR at 90 abd: 85 degrees / 35 degrees  He notes moderate pain at the extremes of all motions.  STRENGTH: Forward flexion: 4/5 Abduction: 4/5 External rotation: 4-4+/5 Internal rotation: 4+/5 Pain with RC testing: Mild pain with resisted forward flexion and abduction.  Neurological: The patient is alert and oriented Sensation to light touch appears to be intact and within normal limits Gross motor strength appeared to be equal to 5/5  Vascular : Peripheral pulses felt to be palpable. Capillary refill appears to be intact and within normal limits  Right Shoulder Imaging, MRI: MRI Shoulder Cartilage: No cartilage abnormality. MRI Shoulder Rotator Cuff: Mild tendinosis of the supraspinatus and infraspinatus tendons with a small insertional interstitial tear along the anteriormost aspect of the supraspinatus tendon. No retraction. MRI Shoulder Labrum / Biceps: Biceps tendinopathy. MRI Shoulder Bone: Normal bone.  Impression: 1. Right rotator cuff tendinitis 2. Nontraumatic incomplete tear of right rotator cuff 3. Tendinitis upper bicep tendon right shoulder  Plan:  The treatment options were discussed with the patient. In addition, patient educational materials were provided regarding the diagnosis and treatment options. The patient is quite frustrated by his symptoms and functional limitations, and is ready to consider more aggressive treatment options. Therefore, I have recommended a surgical procedure, specifically a right shoulder arthroscopy with debridement, decompression, possible rotator cuff repair, and probable biceps tenodesis. The procedure was discussed with the patient, as were the potential risks (including bleeding, infection, nerve and/or blood vessel injury, persistent or recurrent pain, failure of the repair, progression of arthritis, need for further  surgery, blood clots, strokes, heart attacks and/or arhythmias, pneumonia, etc.) and benefits. The patient states his understanding and wishes to proceed. All of the patient's questions and concerns were answered. He can call any time with further concerns. He will follow up post-surgery, routine.     H&P reviewed and patient re-examined. No changes.

## 2023-11-05 NOTE — Anesthesia Procedure Notes (Signed)
 Anesthesia Regional Block: Interscalene brachial plexus block   Pre-Anesthetic Checklist: , timeout performed,  Correct Patient, Correct Site, Correct Laterality,  Correct Procedure, Correct Position, site marked,  Risks and benefits discussed,  Surgical consent,  Pre-op evaluation,  At surgeon's request and post-op pain management  Laterality: Right  Prep: chloraprep       Needles:  Injection technique: Single-shot  Needle Type: Echogenic Needle     Needle Length: 4cm  Needle Gauge: 25     Additional Needles:   Procedures:,,,, ultrasound used (permanent image in chart),,    Narrative:  Start time: 11/05/2023 9:10 AM End time: 11/05/2023 9:15 AM Injection made incrementally with aspirations every 5 mL.  Performed by: Personally  Anesthesiologist: Leavy Ned, MD  Additional Notes: Patient's chart reviewed and they were deemed appropriate candidate for procedure, at surgeon's request. Patient educated about risks, benefits, and alternatives of the block including but not limited to: temporary or permanent nerve damage, bleeding, infection, damage to surround tissues, pneumothorax, hemidiaphragmatic paralysis, unilateral Horner's syndrome, block failure, local anesthetic toxicity. Patient expressed understanding. A formal time-out was conducted consistent with institution rules.  Monitors were applied, and minimal sedation used (see nursing record). The site was prepped with skin prep and allowed to dry, and sterile gloves were used. A high frequency linear ultrasound probe with probe cover was utilized throughout. C5-7 nerve roots located and appeared anatomically normal, local anesthetic injected around them, and echogenic block needle trajectory was monitored throughout. Aspiration performed every 5ml. Lung and blood vessels were avoided. All injections were performed without resistance and free of blood and paresthesias. The patient tolerated the procedure well.  Injectate: 20ml  exparel  + 10ml 0.5% bupivacaine 

## 2023-11-05 NOTE — Op Note (Signed)
 11/05/2023  11:49 AM  Patient:   Jerry JONETTA Rower, MD  Pre-Op Diagnosis:   Impingement/tendinopathy with partial-thickness rotator cuff tear, right shoulder.  Post-Op Diagnosis:   Impingement/tendinopathy with partial-thickness subscapularis and supraspinatus tears, type I labral tear, and biceps tendinopathy, right shoulder.  Procedure:   Extensive arthroscopic debridement, arthroscopic subscapularis tendon repair, arthroscopic subacromial decompression, mini-open repair of supraspinatus tendon with Regeneten patch, and mini-open biceps tenodesis, right shoulder.  Anesthesia:   General endotracheal with interscalene block using Exparel  placed preoperatively by the anesthesiologist.  Surgeon:   DOROTHA Reyes Maltos, MD  Assistant:   Toribio Alas, RNFA; Windell Britain, PA-S  Findings:   As above. There was an articular sided partial-thickness tear involving the superior insertional fibers of the subscapularis tendon with approximately 50% compromise of the footprint, as well as a an articular sided partial-thickness tear involving the anterior insertional fibers of the supraspinatus tendon with approximately 25% compromise of the footprint. The remainder of the rotator cuff was in satisfactory condition. There was moderate lip sticking along the biceps tendon without partial full-thickness tearing. There was a grade 1 SLAP tear extending from the 11:00 to the 1:30 position without frank detachment from the glenoid rim.  The articular surfaces of the glenoid and humerus both were in satisfactory condition.  Complications:   None  Fluids:   700 cc  Estimated blood loss:   5 cc  Tourniquet time:   None  Drains:   None  Closure:   Staples      Brief clinical note:   The patient is a 68 year old male with a history of progressively worsening right shoulder pain. The patient's symptoms have progressed despite medications, activity modification, etc. The patient's history and examination are  consistent with impingement/tendinopathy with a rotator cuff tear. These findings were confirmed by MRI scan. The patient presents at this time for definitive management of these shoulder symptoms.  Procedure:   The patient underwent placement of an interscalene block using Exparel  by the anesthesiologist in the preoperative holding area before being brought into the operating room and lain in the supine position. The patient then underwent general endotracheal intubation and anesthesia before being repositioned in the beach chair position using the beach chair positioner. The right shoulder and upper extremity were prepped with ChloraPrep solution before being draped sterilely. Preoperative antibiotics were administered. A timeout was performed to confirm the proper surgical site before the expected portal sites and incision site were injected with 0.5% Sensorcaine  with epinephrine .   A posterior portal was created and the glenohumeral joint thoroughly inspected with the findings as described above. An anterior portal was created using an outside-in technique. The labrum and rotator cuff were further probed, again confirming the above-noted findings. The areas of labral tearing were debrided back to stable margins using the full-radius resector, as were the torn portions of the subscapularis and supraspinatus tendon tears. Areas of synovitis also were debrided back to stable margins using the full-radius resector. The ArthroCare wand was inserted and used to release the biceps tendon from his labral anchor. It also was used to obtain hemostasis as well as to anneal the labrum superiorly and anteriorly. The instruments were removed from the joint after suctioning the excess fluid.  The camera was repositioned through the posterior portal into the subacromial space. A separate lateral portal was created using an outside-in technique. The 3.5 mm full-radius resector was introduced and used to perform a  subtotal bursectomy. The ArthroCare wand was then inserted and used  to remove the periosteal tissue off the undersurface of the anterior third of the acromion as well as to recess the coracoacromial ligament from its attachment along the anterior and lateral margins of the acromion. The 4.0 mm acromionizing bur was introduced and used to complete the decompression by removing the undersurface of the anterior third of the acromion. The full radius resector was reintroduced to remove any residual bony debris before the ArthroCare wand was reintroduced to obtain hemostasis.   A separate superolateral portal site was created using an outside in technique to serve as a working portal  The exposed portion of the lesser tuberosity was roughened with a full-radius resector to promote healing before the partial-thickness subscapularis tendon tear was repaired using a single Mitek BioKnotless anchor. The adequacy of repair was confirmed both by probing as well as with passive external rotation of the shoulder. The instruments were then removed from the subacromial space after suctioning the excess fluid.  An approximately 4-5 cm incision was made over the anterolateral aspect of the shoulder beginning at the anterolateral corner of the acromion and extending distally in line with the bicipital groove. This incision was carried down through the subcutaneous tissues to expose the deltoid fascia. The raphae between the anterior and middle thirds was identified and this plane developed to provide access into the subacromial space. Additional bursal tissues were debrided sharply using Metzenbaum scissors. The supraspinatus tendon was carefully palpated. A soft spot was identified in the anterior portion of the supraspinatus tendon, confirming the presence of the partial-thickness tear.   The bicipital groove was identified by palpation and opened for 1-1.5 cm. The biceps tendon stump was retrieved through this defect. The  floor of the bicipital groove was roughened with a curet before another Biomet 2.9 mm JuggerKnot anchor was inserted. Both sets of sutures were passed through the biceps tendon and tied securely to effect the tenodesis. The bicipital sheath was reapproximated using two #0 Ethibond interrupted sutures, incorporating the biceps tendon to further reinforce the tenodesis.  Attention was returned to the supraspinatus tendon.  Given that the bursal sided fibers appear to be in good shape and the partial-thickness tear did not exceed 50%, it was felt best to simply apply a [employer] Regeneten patch over the site of the defect rather than to complete the tear and perform a formal repair.  Therefore a medium sized patch was selected and secured over the supraspinatus tendon using the appropriate bone and soft tissue staples.  The wound was copiously irrigated with sterile saline solution before the deltoid raphae was reapproximated using 2-0 Vicryl interrupted sutures. The subcutaneous tissues were closed in two layers using 2-0 Vicryl interrupted sutures before the skin was closed using staples. The portal sites also were closed using staples. A sterile bulky dressing was applied to the shoulder before the arm was placed into a shoulder immobilizer. The patient was then awakened, extubated, and returned to the recovery room in satisfactory condition after tolerating the procedure well.

## 2023-11-05 NOTE — Anesthesia Postprocedure Evaluation (Signed)
 Anesthesia Post Note  Patient: Jerry JONETTA Rower, MD  Procedure(s) Performed: RIGHT SHOULDER ARTHROSCOPY WITH DEBRIDEMENT, DECOMPRESSION, ROTATOR CUFF REPAIR, AND BICEPS TENODESIS. (Right: Shoulder)  Patient location during evaluation: PACU Anesthesia Type: General Level of consciousness: awake and alert Pain management: pain level controlled Vital Signs Assessment: post-procedure vital signs reviewed and stable Respiratory status: spontaneous breathing, nonlabored ventilation, respiratory function stable and patient connected to nasal cannula oxygen Cardiovascular status: blood pressure returned to baseline and stable Postop Assessment: no apparent nausea or vomiting Anesthetic complications: no  No notable events documented.   Last Vitals:  Vitals:   11/05/23 1150 11/05/23 1200  BP: 104/63 136/89  Pulse: 67 70  Resp: 20 19  Temp: 36.6 C   SpO2: 99% 100%    Last Pain:  Vitals:   11/05/23 1150  TempSrc:   PainSc: 0-No pain                 Debby Mines

## 2023-11-05 NOTE — Anesthesia Procedure Notes (Addendum)
 Procedure Name: Intubation Date/Time: 11/05/2023 10:06 AM  Performed by: Niki Manus SAUNDERS, CRNAPre-anesthesia Checklist: Patient identified, Patient being monitored, Timeout performed, Emergency Drugs available and Suction available Patient Re-evaluated:Patient Re-evaluated prior to induction Oxygen Delivery Method: Circle system utilized Preoxygenation: Pre-oxygenation with 100% oxygen Induction Type: IV induction Ventilation: Mask ventilation without difficulty Laryngoscope Size: Mac and 4 Grade View: Grade I Tube type: Oral Tube size: 7.5 mm Number of attempts: 1 Airway Equipment and Method: Stylet Placement Confirmation: ETT inserted through vocal cords under direct vision, positive ETCO2 and breath sounds checked- equal and bilateral Secured at: 21 cm Tube secured with: Tape Dental Injury: Teeth and Oropharynx as per pre-operative assessment

## 2023-11-05 NOTE — Progress Notes (Signed)
 Upon arrival to post op and after standing to void, patient c/o's nausea, no emesis. Medicated as orderred with 4mg  zofran SIVP, Gayla [facility]us accepting RN made aware.

## 2023-11-05 NOTE — Transfer of Care (Signed)
 Immediate Anesthesia Transfer of Care Note  Patient: Jerry JONETTA Rower, MD  Procedure(s) Performed: RIGHT SHOULDER ARTHROSCOPY WITH DEBRIDEMENT, DECOMPRESSION, ROTATOR CUFF REPAIR, AND BICEPS TENODESIS. (Right: Shoulder)  Patient Location: PACU  Anesthesia Type:General  Level of Consciousness: awake and alert   Airway & Oxygen Therapy: Patient Spontanous Breathing and Patient connected to face mask oxygen  Post-op Assessment: Report given to RN and Post -op Vital signs reviewed and stable  Post vital signs: Reviewed and stable  Last Vitals:  Vitals Value Taken Time  BP    Temp    Pulse 68 11/05/23 1155  Resp 11 11/05/23 1155  SpO2 99 % 11/05/23 1155  Vitals shown include unfiled device data.  Last Pain:  Vitals:   11/05/23 0821  TempSrc: Temporal  PainSc: 0-No pain      Patients Stated Pain Goal: 0 (11/05/23 9178)  Complications: No notable events documented.

## 2023-11-05 NOTE — Anesthesia Preprocedure Evaluation (Signed)
 Anesthesia Evaluation  Patient identified by MRN, date of birth, ID band Patient awake    Reviewed: Allergy & Precautions, NPO status , Patient's Chart, lab work & pertinent test results  Airway Mallampati: II  TM Distance: >3 FB Neck ROM: full    Dental  (+) Dental Advidsory Given, Teeth Intact   Pulmonary neg pulmonary ROS   Pulmonary exam normal        Cardiovascular hypertension, On Medications negative cardio ROS Normal cardiovascular exam     Neuro/Psych  PSYCHIATRIC DISORDERS Anxiety Depression    negative neurological ROS     GI/Hepatic Neg liver ROS,GERD  Medicated and Controlled,,  Endo/Other  negative endocrine ROS    Renal/GU      Musculoskeletal   Abdominal   Peds  Hematology negative hematology ROS (+)   Anesthesia Other Findings Past Medical History: No date: Adopted No date: Anxiety No date: Central retinal vein occlusion No date: Depression No date: GERD (gastroesophageal reflux disease) No date: Glaucoma No date: HTN (hypertension) No date: Hyperlipidemia 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) No date: Pilonidal cyst  Past Surgical History: 2009: COLONOSCOPY     Comment:  Dr Viktoria 01/10/2019: COLONOSCOPY WITH PROPOFOL ; N/A     Comment:  Procedure: COLONOSCOPY WITH PROPOFOL ;  Surgeon: Viktoria Lamar DASEN, MD;  Location: [facility] END[facility];  Service:               Endoscopy;  Laterality: N/A; 04/01/2017: EXCISION OF MESH; Left     Comment:  Procedure: REMOVAL MESH PLUG AND NEURORRHAPHY;  Surgeon:              Dessa Reyes ORN, MD;  Location: [facility] ORS;  Service:               General;  Laterality: Left; 1996: INGUINAL HERNIA REPAIR; Right 09/15/2016: INGUINAL HERNIA REPAIR; Left     Comment:  Procedure: HERNIA REPAIR INGUINAL ADULT;  Surgeon:               Reyes ORN Dessa, MD;  Location: [facility] ORS;  Service:               General;  Laterality: Left; No  date: PILONIDAL CYST / SINUS EXCISION     Comment:  x2 No date: TONSILLECTOMY 2009: UPPER GI ENDOSCOPY     Comment:  Dr Viktoria  BMI    Body Mass Index: 24.14 kg/m      Reproductive/Obstetrics negative OB ROS                             Anesthesia Physical Anesthesia Plan  ASA: 2  Anesthesia Plan: General ETT and General   Post-op Pain Management: Regional block*   Induction: Intravenous  PONV Risk Score and Plan: 2 and Ondansetron , Dexamethasone  and Midazolam   Airway Management Planned: Oral ETT  Additional Equipment:   Intra-op Plan:   Post-operative Plan: Extubation in OR  Informed Consent: I have reviewed the patients History and Physical, chart, labs and discussed the procedure including the risks, benefits and alternatives for the proposed anesthesia with the patient or authorized representative who has indicated his/her understanding and acceptance.     Dental Advisory Given  Plan Discussed with: Anesthesiologist, CRNA and Surgeon  Anesthesia Plan Comments: (Patient consented for risks of anesthesia including but not limited to:  - adverse reactions to medications - damage to  eyes, teeth, lips or other oral mucosa - nerve damage due to positioning  - sore throat or hoarseness - Damage to heart, brain, nerves, lungs, other parts of body or loss of life  Patient voiced understanding and assent.)       Anesthesia Quick Evaluation

## 2023-11-05 NOTE — Discharge Instructions (Addendum)
 Orthopedic discharge instructions: Keep dressing dry and intact.  May shower after dressing changed on post-op day #4 (Monday).  Cover staples with Band-Aids after drying off. Apply ice frequently to shoulder. Take ibuprofen 600-800 mg TID with meals for 3-5 days, then as necessary. Take oxycodone  as prescribed when needed.  May supplement with ES Tylenol  if necessary. Keep shoulder immobilizer on at all times except may remove for bathing purposes. Follow-up in 10-14 days or as scheduled.  Information for Discharge Teaching: EXPAREL  (bupivacaine  liposome injectable suspension)   Pain relief is important to your recovery. The goal is to control your pain so you can move easier and return to your normal activities as soon as possible after your procedure. Your physician may use several types of medicines to manage pain, swelling, and more.  Your surgeon or anesthesiologist gave you EXPAREL (bupivacaine ) to help control your pain after surgery.  EXPAREL  is a local anesthetic designed to release slowly over an extended period of time to provide pain relief by numbing the tissue around the surgical site. EXPAREL  is designed to release pain medication over time and can control pain for up to 72 hours. Depending on how you respond to EXPAREL , you may require less pain medication during your recovery. EXPAREL  can help reduce or eliminate the need for opioids during the first few days after surgery when pain relief is needed the most. EXPAREL  is not an opioid and is not addictive. It does not cause sleepiness or sedation.   Important! A teal colored band has been placed on your arm with the date, time and amount of EXPAREL  you have received. Please leave this armband in place for the full 96 hours following administration, and then you may remove the band. If you return to the hospital for any reason within 96 hours following the administration of EXPAREL , the armband provides important information  that your health care providers to know, and alerts them that you have received this anesthetic.    Possible side effects of EXPAREL : Temporary loss of sensation or ability to move in the area where medication was injected. Nausea, vomiting, constipation Rarely, numbness and tingling in your mouth or lips, lightheadedness, or anxiety may occur. Call your doctor right away if you think you may be experiencing any of these sensations, or if you have other questions regarding possible side effects.  Follow all other discharge instructions given to you by your surgeon or nurse. Eat a healthy diet and drink plenty of water or other fluids.  SHOULDER SLING IMMOBILIZER   VIDEO Slingshot 2 Shoulder Brace Application - YouTube ---[email or web address].[email or web address]  INSTRUCTIONS While supporting the injured arm, slide the forearm into the sling. Wrap the adjustable shoulder strap around the neck and shoulders and attach the strap end to the sling using  the "alligator strap tab."  Adjust the shoulder strap to the required length. Position the shoulder pad behind the neck. To secure the shoulder pad location (optional), pull the shoulder strap away from the shoulder pad, unfold the hook material on the top of the pad, then press the shoulder strap back onto the hook material to secure the pad in place. Attach the closure strap across the open top of the sling. Position the strap so that it holds the arm securely in the sling. Next, attach the thumb strap to the open end of the sling between the thumb and fingers. After sling has been fit, it may be easily removed and reapplied using the quick release buckle on shoulder strap.  If a neutral pillow or 15 abduction pillow is included, place the pillow at the waistline. Attach the sling to the pillow, lining up hook material on the pillow with the loop on sling. Adjust the waist strap to fit.  If waist strap is too long, cut it to fit. Use the small  piece of double sided hook material (located on top of the pillow) to secure the strap end. Place the double sided hook material on the inside of the cut strap end and secure it to the waist strap.     If no pillow is included, attach the waist strap to the sling and adjust to fit.    Washing Instructions: Straps and sling must be removed and cleaned regularly depending on your activity level and perspiration. Hand wash straps and sling in cold water with mild detergent, rinse, air dry

## 2023-11-07 ENCOUNTER — Encounter: Payer: Self-pay | Admitting: Surgery

## 2023-11-12 NOTE — Progress Notes (Signed)
This patch was applied for soft tissue reinforcement.  Thank you!

## 2023-11-16 ENCOUNTER — Encounter: Payer: Self-pay | Admitting: Surgery

## 2024-04-04 ENCOUNTER — Encounter: Payer: Self-pay | Admitting: Surgery

## 2024-05-25 ENCOUNTER — Encounter: Payer: Self-pay | Admitting: Surgery
